# Patient Record
Sex: Male | Born: 1985 | Race: Black or African American | Hispanic: No | Marital: Married | Smoking: Never smoker
Health system: Southern US, Community
[De-identification: ages and names within clinical notes are randomized; demographics above are authoritative.]

## PROBLEM LIST (undated history)

## (undated) DIAGNOSIS — J45909 Unspecified asthma, uncomplicated: Secondary | ICD-10-CM

---
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
## 2005-06-13 ENCOUNTER — Emergency Department (HOSPITAL_COMMUNITY): Admission: EM | Admit: 2005-06-13 | Discharge: 2005-06-13 | Payer: Self-pay | Admitting: Family Medicine

## 2005-06-13 ENCOUNTER — Emergency Department (HOSPITAL_COMMUNITY): Admission: EM | Admit: 2005-06-13 | Discharge: 2005-06-13 | Payer: Self-pay | Admitting: Emergency Medicine

## 2009-02-21 ENCOUNTER — Emergency Department (HOSPITAL_COMMUNITY): Admission: EM | Admit: 2009-02-21 | Discharge: 2009-02-21 | Payer: Self-pay | Admitting: Emergency Medicine

## 2012-02-01 ENCOUNTER — Emergency Department (HOSPITAL_COMMUNITY)
Admission: EM | Admit: 2012-02-01 | Discharge: 2012-02-01 | Disposition: A | Discharge status: Home / Self Care | Payer: Self-pay | Attending: Emergency Medicine | Admitting: Emergency Medicine

## 2012-02-01 ENCOUNTER — Encounter (HOSPITAL_COMMUNITY): Payer: Self-pay | Admitting: Emergency Medicine

## 2012-02-01 DIAGNOSIS — J45901 Unspecified asthma with (acute) exacerbation: Secondary | ICD-10-CM | POA: Insufficient documentation

## 2012-02-01 HISTORY — DX: Unspecified asthma, uncomplicated: J45.909

## 2012-02-01 MED ORDER — ALBUTEROL SULFATE HFA 108 (90 BASE) MCG/ACT IN AERS: 2.0000 | INHALATION_SPRAY | RESPIRATORY_TRACT | Status: DC | PRN

## 2012-02-01 MED ORDER — ALBUTEROL SULFATE (5 MG/ML) 0.5% IN NEBU
2.5000 mg | INHALATION_SOLUTION | Freq: Once | RESPIRATORY_TRACT | Status: AC
  Administered 2012-02-01: 2.5 mg via RESPIRATORY_TRACT
  Filled 2012-02-01: qty 0.5

## 2012-02-01 MED ORDER — AEROCHAMBER PLUS FLO-VU LARGE MISC
1.0000 | Freq: Once | Status: AC
  Administered 2012-02-01: 1
  Filled 2012-02-01 (×4): qty 1

## 2012-02-01 MED ORDER — PREDNISONE 20 MG PO TABS: 40.0000 mg | ORAL_TABLET | Freq: Every day | ORAL | Status: DC

## 2012-02-01 MED ORDER — AZITHROMYCIN 250 MG PO TABS: 250.0000 mg | ORAL_TABLET | Freq: Every day | ORAL | Status: DC

## 2012-02-01 NOTE — ED Provider Notes (Signed)
History     CSN: [id number]  Arrival date & time 02/01/12  0702   First MD Initiated Contact with Patient 02/01/12 [phone]      Chief Complaint  Patient presents with  . Asthma    (Consider location/radiation/quality/duration/timing/severity/associated sxs/prior treatment) HPI Comments: This is a 26 year old male who presents to the ED with a chief complaint of asthma exacerbation.  He states that his asthma has been waking him up nearly every morning this week.  Normally he uses a primitine mist over night, but that he has run out and he believes this is what is causing the asthma exacerbations.  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  He denies chest pain, shortness of breath, nausea, vomiting, diarrhea, constipation.  He endorses wheezing.  The history is provided by the patient. No language interpreter was used.    Past Medical History  Diagnosis Date  . Asthma     History reviewed. No pertinent past surgical history.  Family History  Problem Relation Age of Onset  . Hypertension Other   . Diabetes Other     History  Substance Use Topics  . Smoking status: Never Smoker   . Smokeless tobacco: Not on file  . Alcohol Use: Yes     social      Review of Systems  Unable to perform ROS Constitutional: Negative for fever.  HENT: Negative for congestion.   Eyes: Negative for visual disturbance.  Respiratory: Positive for wheezing. Negative for cough, chest tightness and shortness of breath.   Cardiovascular: Negative for chest pain and palpitations.  Gastrointestinal: Negative for nausea, vomiting, abdominal pain and diarrhea.  Genitourinary: Negative for dysuria.  Musculoskeletal: Negative for back pain.  Skin: Negative for rash.  Neurological: Negative for weakness.  Psychiatric/Behavioral: Negative for agitation.  All other systems reviewed and are negative.    Allergies  Review of patient's allergies  indicates no known allergies.  Home Medications  No current outpatient prescriptions on file.  BP 129/90  Pulse 72  Temp 97.8 F (36.6 C) (Oral)  Resp 18  SpO2 99%  Physical Exam  Nursing note and vitals reviewed. Constitutional: He is oriented to person, place, and time. He appears well-developed and well-nourished.  HENT:  Head: Normocephalic and atraumatic.  Eyes: Conjunctivae normal and EOM are normal. Pupils are equal, round, and reactive to light.  Neck: Normal range of motion. Neck supple.  Cardiovascular: Normal rate and normal heart sounds.   Pulmonary/Chest: Effort normal. He has wheezes.  Abdominal: Soft. Bowel sounds are normal.  Musculoskeletal: Normal range of motion.  Neurological: He is alert and oriented to person, place, and time.  Skin: Skin is warm and dry.  Psychiatric: He has a normal mood and affect. His behavior is normal. Judgment and thought content normal.    ED Course  Procedures (including critical care time)  Labs Reviewed - No data to display No results found. ED ECG REPORT  I personally interpreted this EKG   Date: 02/01/2012   Rate: 70  Rhythm: sinus arrhythmia  QRS Axis: normal  Intervals: normal  ST/T Wave abnormalities: early repolarization  Conduction Disutrbances:none  Narrative Interpretation:   Old EKG Reviewed: none available     1. Asthma exacerbation       MDM  26 year old with asthma attack.  Will administer nebs treatment.  The patient states he recently noticed an irregular heart rhythm, so I will also get an  EKG as this is undocumented to this point.  EKG results documented above. Patient has a sinus arrhythmia and early repolarization.  Education provided, and return precautions given regarding chest pain, SOB, syncope, palpitations.  I have discussed this patient with Dr. Rulon Abide, and he has also gone to see him.  Will prescribe albuterol inhaler with spacer, z-pak, and short course of prednisone.  Have discussed this  with the patient.  He is agreeable to the plan.  Instructed the patient on establishing a PCP under the new AFA.  Recommend follow-up with PCP. The patient is stable and ready for discharge.          Roxy Horseman, PA-C 02/01/12 [phone]

## 2012-02-01 NOTE — ED Notes (Signed)
MD at bedside. 

## 2012-02-01 NOTE — ED Notes (Signed)
Discharge given to patient, pt denied any questions. Now waiting for the aerochamber from the pharmacy then pt will be discharge.

## 2012-02-01 NOTE — ED Notes (Signed)
Pt states he woke up around 0400 today with difficulty breathing  Pt states he was able to go to sleep but woke around 6 and it was worse  Pt states this has been happening every other night for the past couple of weeks  Pt states he has been using primatine mist OTC but ran out

## 2012-02-02 NOTE — ED Provider Notes (Signed)
Medical screening examination/treatment/procedure(s) were performed by non-physician practitioner and as supervising physician I was immediately available for consultation/collaboration.  John-Adam Danicka Hourihan, M.D.     John-Adam Elgie Maziarz, MD 02/02/12 1351 

## 2013-03-04 ENCOUNTER — Encounter (HOSPITAL_COMMUNITY): Payer: Self-pay | Admitting: Emergency Medicine

## 2013-03-04 ENCOUNTER — Emergency Department (HOSPITAL_COMMUNITY)
Admission: EM | Admit: 2013-03-04 | Discharge: 2013-03-04 | Disposition: A | Discharge status: Home / Self Care | Payer: No Typology Code available for payment source | Attending: Emergency Medicine | Admitting: Emergency Medicine

## 2013-03-04 ENCOUNTER — Emergency Department (HOSPITAL_COMMUNITY): Payer: No Typology Code available for payment source

## 2013-03-04 DIAGNOSIS — S20211A Contusion of right front wall of thorax, initial encounter: Secondary | ICD-10-CM

## 2013-03-04 DIAGNOSIS — W219XXA Striking against or struck by unspecified sports equipment, initial encounter: Secondary | ICD-10-CM | POA: Insufficient documentation

## 2013-03-04 DIAGNOSIS — Z79899 Other long term (current) drug therapy: Secondary | ICD-10-CM | POA: Insufficient documentation

## 2013-03-04 DIAGNOSIS — S20219A Contusion of unspecified front wall of thorax, initial encounter: Secondary | ICD-10-CM | POA: Insufficient documentation

## 2013-03-04 DIAGNOSIS — Y9239 Other specified sports and athletic area as the place of occurrence of the external cause: Secondary | ICD-10-CM | POA: Insufficient documentation

## 2013-03-04 DIAGNOSIS — J45909 Unspecified asthma, uncomplicated: Secondary | ICD-10-CM | POA: Insufficient documentation

## 2013-03-04 DIAGNOSIS — Y9362 Activity, american flag or touch football: Secondary | ICD-10-CM | POA: Insufficient documentation

## 2013-03-04 MED ORDER — METHOCARBAMOL 500 MG PO TABS: 500.0000 mg | ORAL_TABLET | Freq: Two times a day (BID) | ORAL | Status: DC

## 2013-03-04 MED ORDER — NAPROXEN 500 MG PO TABS: 500.0000 mg | ORAL_TABLET | Freq: Two times a day (BID) | ORAL | Status: DC

## 2013-03-04 NOTE — ED Provider Notes (Signed)
CSN: [id number]     Arrival date & time 03/04/13  2015 History  This chart was scribed for non-physician practitioner, Fayrene Helper, PA-C,working with Geoffery Lyons, MD, by Karle Plumber, ED Scribe.  This patient was seen in room TR07C/TR07C and the patient's care was started at 9:48 PM.  Chief Complaint  Patient presents with  . Shoulder Injury   The history is provided by the patient. No language interpreter was used.   HPI Comments:  Tyler Jacobs is a 27 y.o. male who presents to the Emergency Department complaining of a left shoulder injury onset several hours ago. He reports the pain is a sharp pain from his trapezius muscle down into his chest.  Pt states he was playing flag football with no pads or protection and collided with another player. He states the pain worsens with movement. Pt denies numbness and tingling.    Past Medical History  Diagnosis Date  . Asthma    History reviewed. No pertinent past surgical history. Family History  Problem Relation Age of Onset  . Hypertension Other   . Diabetes Other    History  Substance Use Topics  . Smoking status: Never Smoker   . Smokeless tobacco: Not on file  . Alcohol Use: Yes     Comment: social    Review of Systems  Constitutional: Negative for fever.  Musculoskeletal: Positive for myalgias. Negative for back pain and neck pain.  Neurological: Negative for syncope, numbness and headaches.    Allergies  Review of patient's allergies indicates no known allergies.  Home Medications   Current Outpatient Rx  Name  Route  Sig  Dispense  Refill  . albuterol (PROVENTIL HFA;VENTOLIN HFA) 108 (90 BASE) MCG/ACT inhaler   Inhalation   Inhale 2 puffs into the lungs every 4 (four) hours as needed for wheezing or shortness of breath.   1 Inhaler   3    Triage Vitals: BP 126/83  Pulse 87  Temp(Src) 98.4 F (36.9 C) (Oral)  Resp 16  Ht 5\' 6"  (1.676 m)  Wt 206 lb (93.441 kg)  BMI 33.27 kg/m2  SpO2 96% Physical Exam   Nursing note and vitals reviewed. Constitutional: He is oriented to person, place, and time. He appears well-developed and well-nourished. No distress.  HENT:  Head: Normocephalic and atraumatic.  Eyes: Conjunctivae are normal. No scleral icterus.  Neck: Neck supple.  Cardiovascular: Normal rate and intact distal pulses.   Pulmonary/Chest: Effort normal. No stridor. No respiratory distress.  Abdominal: Normal appearance.  Musculoskeletal:  Right paracervical and trapezius tenderness. Tenderness to upper right pectoralis. Chest without crepitus, emphysema or paradoxical chest movement. Right shoulder with full ROM. No obvious deformity.   Neurological: He is alert and oriented to person, place, and time.  Skin: Skin is warm and dry. No rash noted.  Psychiatric: He has a normal mood and affect. His behavior is normal.    ED Course  Procedures (including critical care time) DIAGNOSTIC STUDIES: Oxygen Saturation is 96% on RA, adequate by my interpretation.   COORDINATION OF CARE: 9:52 PM- Will give pt pain medication and muscle relaxer. Pt verbalizes understanding and agrees to plan.  Likely chest contusion, and muscle strain as it is reproducible. Doubt rib fx, shoulder dislocation, or spine injury.  RICE therapy suggested.      Medications - No data to display  Labs Review Labs Reviewed - No data to display Imaging Review No results found.  EKG Interpretation   None  MDM   1. Chest wall contusion, right, initial encounter    BP 126/83  Pulse 87  Temp(Src) 98.4 F (36.9 C) (Oral)  Resp 16  Ht 5\' 6"  (1.676 m)  Wt 206 lb (93.441 kg)  BMI 33.27 kg/m2  SpO2 96%   I personally performed the services described in this documentation, which was scribed in my presence. The recorded information has been reviewed and is accurate.     Fayrene Helper, PA-C 03/04/13 2227

## 2013-03-04 NOTE — ED Provider Notes (Signed)
Medical screening examination/treatment/procedure(s) were performed by non-physician practitioner and as supervising physician I was immediately available for consultation/collaboration.  EKG Interpretation   None        Kelsy Polack, MD 03/04/13 2355 

## 2013-03-04 NOTE — ED Notes (Signed)
Pt. injured his right shoulder with pain while playing flag football this afternoon , pain worse with movement and certain positions , pain radiating to right side of neck , ambulatory .

## 2013-04-07 ENCOUNTER — Encounter (HOSPITAL_COMMUNITY): Payer: Self-pay | Admitting: Emergency Medicine

## 2013-04-07 ENCOUNTER — Emergency Department (HOSPITAL_COMMUNITY): Payer: No Typology Code available for payment source

## 2013-04-07 ENCOUNTER — Emergency Department (HOSPITAL_COMMUNITY)
Admission: EM | Admit: 2013-04-07 | Discharge: 2013-04-07 | Disposition: A | Discharge status: Home / Self Care | Payer: No Typology Code available for payment source | Attending: Emergency Medicine | Admitting: Emergency Medicine

## 2013-04-07 DIAGNOSIS — R296 Repeated falls: Secondary | ICD-10-CM | POA: Insufficient documentation

## 2013-04-07 DIAGNOSIS — S8990XA Unspecified injury of unspecified lower leg, initial encounter: Secondary | ICD-10-CM | POA: Insufficient documentation

## 2013-04-07 DIAGNOSIS — X500XXA Overexertion from strenuous movement or load, initial encounter: Secondary | ICD-10-CM | POA: Insufficient documentation

## 2013-04-07 DIAGNOSIS — Y929 Unspecified place or not applicable: Secondary | ICD-10-CM | POA: Insufficient documentation

## 2013-04-07 DIAGNOSIS — S8992XA Unspecified injury of left lower leg, initial encounter: Secondary | ICD-10-CM

## 2013-04-07 DIAGNOSIS — J45909 Unspecified asthma, uncomplicated: Secondary | ICD-10-CM | POA: Insufficient documentation

## 2013-04-07 DIAGNOSIS — Z79899 Other long term (current) drug therapy: Secondary | ICD-10-CM | POA: Insufficient documentation

## 2013-04-07 DIAGNOSIS — Y9301 Activity, walking, marching and hiking: Secondary | ICD-10-CM | POA: Insufficient documentation

## 2013-04-07 MED ORDER — NAPROXEN 500 MG PO TABS: 500.0000 mg | ORAL_TABLET | Freq: Two times a day (BID) | ORAL | Status: DC

## 2013-04-07 NOTE — ED Notes (Signed)
The pt is c/o lt knee pain since this am when he was walking.  He thinks he hyperextended the knee

## 2013-04-07 NOTE — ED Provider Notes (Signed)
CSN: [id number]     Arrival date & time 04/07/13  1814 History  This chart was scribed for non-physician practitioner, Renne Crigler, PA-C, working with Flint Melter, MD, by Shari Heritage, ED Scribe. This patient was seen in room TR08C/TR08C and the patient's care was started at 7:44 PM.  Chief Complaint  Patient presents with  . Knee Injury   The history is provided by the patient. No language interpreter was used.   HPI Comments: Tyler Jacobs is a 27 y.o. male who presents to the Emergency Department complaining of constant left knee pain that radiates down his leg onset this morning while recreating. He rates pain as 7/10 while at rest. He states that he was running, planted his foot and his knee "buckled" causing him to fall. He was able to get up by himself after the fall. He states that he heard a pop and developed immediate pain. Pain is worse while ambulating and with movement of his knee, and is relieved with rest especially while sitting. He hasn't taken any medications for pain relief. He denies any other injuries at this time. There is no numbness or weakness in left lower extremity.   Past Medical History  Diagnosis Date  . Asthma    No past surgical history on file. Family History  Problem Relation Age of Onset  . Hypertension Other   . Diabetes Other    History  Substance Use Topics  . Smoking status: Never Smoker   . Smokeless tobacco: Not on file  . Alcohol Use: Yes     Comment: social    Review of Systems  Constitutional: Negative for activity change.  Musculoskeletal: Positive for arthralgias (Left knee) and myalgias. Negative for back pain, gait problem, joint swelling and neck pain.  Skin: Negative for wound.  Neurological: Negative for weakness and numbness.    Allergies  Review of patient's allergies indicates no known allergies.  Home Medications   Current Outpatient Rx  Name  Route  Sig  Dispense  Refill  . albuterol (PROVENTIL HFA;VENTOLIN HFA)  108 (90 BASE) MCG/ACT inhaler   Inhalation   Inhale 2 puffs into the lungs every 4 (four) hours as needed for wheezing or shortness of breath.   1 Inhaler   3   . methocarbamol (ROBAXIN) 500 MG tablet   Oral   Take 1 tablet (500 mg total) by mouth 2 (two) times daily.   20 tablet   0   . naproxen (NAPROSYN) 500 MG tablet   Oral   Take 1 tablet (500 mg total) by mouth 2 (two) times daily.   30 tablet   0    Triage Vitals: BP 127/77  Pulse 94  Temp(Src) 99 F (37.2 C) (Oral)  Resp 16  Ht 5\' 6"  (1.676 m)  Wt 205 lb (92.987 kg)  BMI 33.10 kg/m2  SpO2 97% Physical Exam  Nursing note and vitals reviewed. Constitutional: He appears well-developed and well-nourished. No distress.  HENT:  Head: Normocephalic and atraumatic.  Eyes: Conjunctivae are normal.  Neck: Normal range of motion. Neck supple. No tracheal deviation present.  Cardiovascular: Normal rate and normal pulses.   Pulses:      Dorsalis pedis pulses are 2+ on the right side, and 2+ on the left side.       Posterior tibial pulses are 2+ on the left side.  Pulmonary/Chest: Effort normal. No respiratory distress.  Musculoskeletal: He exhibits tenderness. He exhibits no edema.  Left hip: Normal.       Left knee: He exhibits normal range of motion, no swelling and no effusion. Tenderness found. Medial joint line, lateral joint line, MCL and LCL tenderness noted. No patellar tendon tenderness noted.       Left ankle: Normal.  General tenderness and joint laxity of left knee. No large effusion. Patient able to extend knee against gravity.   Neurological: He is alert. No sensory deficit.  Motor, sensation, and vascular distal to the injury is fully intact.   Skin: Skin is warm and dry.  Psychiatric: He has a normal mood and affect. His behavior is normal.    ED Course  Procedures  DIAGNOSTIC STUDIES: Oxygen Saturation is 97% on RA, normal by my interpretation.    COORDINATION OF CARE: 7:48 PM- Knee x-ray is  negative for fracture, but suspect non-bony injury. Will provide crutches, knee immobilizer and referral to orthopedics for follow up. Pt advised of plan for treatment and pt agrees.   Imaging Review Dg Knee Complete 4 Views Left  04/07/2013   CLINICAL DATA:  Hyperextended knee playing sports.  Pain.  EXAM: LEFT KNEE - COMPLETE 4+ VIEW  COMPARISON:  None.  FINDINGS: There is no evidence of fracture, dislocation, or joint effusion. There is no evidence of arthropathy or other focal bone abnormality. Soft tissues are unremarkable.  IMPRESSION: Negative.   Electronically Signed   By: Davonna Belling M.D.   On: 04/07/2013 19:31    EKG Interpretation   None      Vital signs reviewed and are as follows: Filed Vitals:   04/07/13 1819  BP: 127/77  Pulse: 94  Temp: 99 F (37.2 C)  Resp: 16   Advised orthopedic f/u for diagnosis and management.   Patient was counseled on RICE protocol and told to rest injury, use ice for no longer than 15 minutes every hour, compress the area, and elevate above the level of their heart as much as possible to reduce swelling.  Questions answered.  Patient verbalized understanding.     MDM   1. Knee injury, left, initial encounter    Suspect ligamentous injury to knee. Crutches, knee immobilizer here with ortho follow-up for evaluation. Lower extremity is neurovascularly intact. Pt can extend knee against gravity. No signs of compartment syndrome.   I personally performed the services described in this documentation, which was scribed in my presence. The recorded information has been reviewed and is accurate.   Renne Crigler, PA-C 04/07/13 2142

## 2013-04-07 NOTE — Progress Notes (Signed)
Orthopedic Tech Progress Note Patient Details:  Tyler Jacobs Mar 23, 1986 [id number]  Ortho Devices Type of Ortho Device: Knee Immobilizer;Crutches Ortho Device/Splint Location: LLE Ortho Device/Splint Interventions: Ordered;Application   [name] 04/07/2013, 8:26 PM

## 2013-04-08 NOTE — ED Provider Notes (Signed)
Medical screening examination/treatment/procedure(s) were performed by non-physician practitioner and as supervising physician I was immediately available for consultation/collaboration.  Rohith Fauth L Indigo Chaddock, MD 04/08/13 0043 

## 2013-12-31 ENCOUNTER — Ambulatory Visit (INDEPENDENT_AMBULATORY_CARE_PROVIDER_SITE_OTHER): Payer: BC Managed Care – PPO | Admitting: Emergency Medicine

## 2013-12-31 VITALS — BP 108/72 | HR 66 | Temp 98.1°F | Resp 16 | Ht 65.75 in | Wt 215.4 lb

## 2013-12-31 DIAGNOSIS — J209 Acute bronchitis, unspecified: Secondary | ICD-10-CM

## 2013-12-31 DIAGNOSIS — G4733 Obstructive sleep apnea (adult) (pediatric): Secondary | ICD-10-CM

## 2013-12-31 MED ORDER — AZITHROMYCIN 250 MG PO TABS: ORAL_TABLET | ORAL | Status: DC

## 2013-12-31 MED ORDER — PROMETHAZINE-CODEINE 6.25-10 MG/5ML PO SYRP: 5.0000 mL | ORAL_SOLUTION | Freq: Four times a day (QID) | ORAL | Status: DC | PRN

## 2013-12-31 NOTE — Progress Notes (Signed)
[facility] [address] [place] [address] [phone]  Date:  12/31/2013   Name:  Tyler Jacobs   DOB:  1985/10/20   MRN:  [id number]  PCP:  No PCP Per Patient    Chief Complaint: Cough and Sore Throat   History of Present Illness:  Tyler Jacobs is a 28 y.o. very pleasant male patient who presents with the following:  Has experienced a cough for the past week.  Productive of scant mucoid sputum.  No wheezing or shortness of breath.  Worse when lays flat Has nasal congestion and drainage.  Mostly mucoid. Moderate post nasal drainage. Sore throat.  No fever or chills. Snores and wakes up during night.  Awakens tired No improvement with over the counter medications or other home remedies. Denies other complaint or health concern today.   There are no active problems to display for this patient.   Past Medical History  Diagnosis Date  . Asthma     History reviewed. No pertinent past surgical history.  History  Substance Use Topics  . Smoking status: Never Smoker   . Smokeless tobacco: Not on file  . Alcohol Use: Yes     Comment: social    Family History  Problem Relation Age of Onset  . Hypertension Other   . Diabetes Other     No Known Allergies  Medication list has been reviewed and updated.  Current Outpatient Prescriptions on File Prior to Visit  Medication Sig Dispense Refill  . albuterol (PROVENTIL HFA;VENTOLIN HFA) 108 (90 BASE) MCG/ACT inhaler Inhale 1-2 puffs into the lungs every 6 (six) hours as needed for wheezing or shortness of breath.      . naproxen (NAPROSYN) 500 MG tablet Take 1 tablet (500 mg total) by mouth 2 (two) times daily.  20 tablet  0   No current facility-administered medications on file prior to visit.    Review of Systems:  As per HPI, otherwise negative.    Physical Examination: Filed Vitals:   12/31/13 1045  BP: 108/72  Pulse: 66  Temp: 98.1 F (36.7 C)  Resp: 16   Filed Vitals:   12/31/13 1045  Height: 5' 5.75" (1.67 m)  Weight: 215 lb 6.4 oz (97.705 kg)   Body mass index is 35.03 kg/(m^2). Ideal Body Weight: Weight in (lb) to have BMI = 25: 153.4  GEN: WDWN, NAD, Non-toxic, A & O x 3 HEENT: Atraumatic, Normocephalic. Neck supple. No masses, No LAD. Ears and Nose: No external deformity. CV: RRR, No M/G/R. No JVD. No thrill. No extra heart sounds. PULM: CTA B, no wheezes, crackles, rhonchi. No retractions. No resp. distress. No accessory muscle use. ABD: S, NT, ND, +BS. No rebound. No HSM. EXTR: No c/c/e NEURO Normal gait.  PSYCH: Normally interactive. Conversant. Not depressed or anxious appearing.  Calm demeanor.    Assessment and Plan: OSA Bronchitis Sleep study zpak Phen c cod  Signed,  Phillips Odor, MD

## 2014-01-01 ENCOUNTER — Telehealth: Payer: Self-pay

## 2014-01-01 NOTE — Telephone Encounter (Signed)
PATIENT STATES HE SAW DR. Dareen Piano ON Monday FOR PROBLEMS SLEEPING. HE WENT TO WORK ON Tuesday, BUT HE WOULD LIKE TO TAKE THE DAY OFF ON Wednesday AND RETURN BACK TO WORK ON Thursday. HE SAYS THE MEDICINE HE WAS GIVEN IS MAKING HIM FEEL TIRED. HE JUST NEEDS TO GET SOME REST. PLEASE CALL HIM WHEN IT IS READY TO BE PICKED UP. BEST PHONE [phone] (CELL)   MBC

## 2014-01-02 NOTE — Telephone Encounter (Signed)
OOW note written. Pt advised.  Letter faxed. Original in the pick up drawer.

## 2014-01-02 NOTE — Telephone Encounter (Signed)
OK for work note as requested. Remind him to take the cough medication only at bedtime.

## 2014-01-02 NOTE — Telephone Encounter (Signed)
Patient is calling for a NOTE for OOW.     He will come in and bring a fax number with him   [phone]

## 2014-01-02 NOTE — Telephone Encounter (Signed)
Pt called again about his work note. I told him we must wait until we get permission from a provider to write his note. I told him we would give him a call once we hear back from a provider.   The note can be faxed to [phone]

## 2014-01-02 NOTE — Telephone Encounter (Signed)
Please advise if OOW note is appropriate.

## 2019-07-13 ENCOUNTER — Emergency Department: Payer: BC Managed Care – PPO

## 2019-07-13 ENCOUNTER — Observation Stay
Admission: EM | Admit: 2019-07-13 | Discharge: 2019-07-15 | Disposition: A | Discharge status: Home / Self Care | Payer: BC Managed Care – PPO | Attending: General Surgery | Admitting: General Surgery

## 2019-07-13 ENCOUNTER — Other Ambulatory Visit: Payer: Self-pay

## 2019-07-13 ENCOUNTER — Ambulatory Visit
Admission: EM | Admit: 2019-07-13 | Discharge: 2019-07-13 | Disposition: A | Discharge status: Other Acute Inpatient Hospital | Payer: BC Managed Care – PPO

## 2019-07-13 DIAGNOSIS — Z79899 Other long term (current) drug therapy: Secondary | ICD-10-CM | POA: Insufficient documentation

## 2019-07-13 DIAGNOSIS — R1031 Right lower quadrant pain: Secondary | ICD-10-CM

## 2019-07-13 DIAGNOSIS — K449 Diaphragmatic hernia without obstruction or gangrene: Secondary | ICD-10-CM | POA: Insufficient documentation

## 2019-07-13 DIAGNOSIS — K353 Acute appendicitis with localized peritonitis, without perforation or gangrene: Principal | ICD-10-CM | POA: Insufficient documentation

## 2019-07-13 DIAGNOSIS — R100 Acute abdomen: Secondary | ICD-10-CM

## 2019-07-13 DIAGNOSIS — Z20822 Contact with and (suspected) exposure to covid-19: Secondary | ICD-10-CM | POA: Diagnosis not present

## 2019-07-13 DIAGNOSIS — K3580 Unspecified acute appendicitis: Secondary | ICD-10-CM | POA: Diagnosis present

## 2019-07-13 DIAGNOSIS — J45909 Unspecified asthma, uncomplicated: Secondary | ICD-10-CM | POA: Diagnosis not present

## 2019-07-13 DIAGNOSIS — Z791 Long term (current) use of non-steroidal anti-inflammatories (NSAID): Secondary | ICD-10-CM | POA: Diagnosis not present

## 2019-07-13 DIAGNOSIS — R1110 Vomiting, unspecified: Secondary | ICD-10-CM

## 2019-07-13 LAB — COMPREHENSIVE METABOLIC PANEL
ALT: 33 U/L (ref 0–44)
AST: 29 U/L (ref 15–41)
Albumin: 4.6 g/dL (ref 3.5–5.0)
Alkaline Phosphatase: 49 U/L (ref 38–126)
Anion gap: 12 (ref 5–15)
BUN: 12 mg/dL (ref 6–20)
CO2: 23 mmol/L (ref 22–32)
Calcium: 9.2 mg/dL (ref 8.9–10.3)
Chloride: 102 mmol/L (ref 98–111)
Creatinine, Ser: 1.18 mg/dL (ref 0.61–1.24)
GFR calc Af Amer: >60 mL/min (ref >60)
GFR calc non Af Amer: >60 mL/min (ref >60)
Glucose, Bld: 126 mg/dL — ABNORMAL HIGH (ref 70–99)
Potassium: 3.8 mmol/L (ref 3.5–5.1)
Sodium: 137 mmol/L (ref 135–145)
Total Bilirubin: 1.1 mg/dL (ref 0.3–1.2)
Total Protein: 8.1 g/dL (ref 6.5–8.1)

## 2019-07-13 LAB — LIPASE, BLOOD: Lipase: 22 U/L (ref 11–51)

## 2019-07-13 LAB — CBC
HCT: 44.4 % (ref 39.0–52.0)
Hemoglobin: 15.7 g/dL (ref 13.0–17.0)
MCH: 28.2 pg (ref 26.0–34.0)
MCHC: 35.4 g/dL (ref 30.0–36.0)
MCV: 79.9 fL — ABNORMAL LOW (ref 80.0–100.0)
Platelets: 188 10*3/uL (ref 150–400)
RBC: 5.56 MIL/uL (ref 4.22–5.81)
RDW: 13.4 % (ref 11.5–15.5)
WBC: 11.7 10*3/uL — ABNORMAL HIGH (ref 4.0–10.5)
nRBC: 0 % (ref 0.0–0.2)

## 2019-07-13 LAB — RESPIRATORY PANEL BY RT PCR (FLU A&B, COVID)
Influenza A by PCR: NEGATIVE
Influenza B by PCR: NEGATIVE
SARS Coronavirus 2 by RT PCR: NEGATIVE

## 2019-07-13 MED ORDER — METRONIDAZOLE IN NACL 5-0.79 MG/ML-% IV SOLN
500.0000 mg | Freq: Three times a day (TID) | INTRAVENOUS | Status: DC
  Administered 2019-07-13 – 2019-07-15 (×5): 500 mg via INTRAVENOUS
  Filled 2019-07-13 (×10): qty 100

## 2019-07-13 MED ORDER — FENTANYL CITRATE (PF) 100 MCG/2ML IJ SOLN
50.0000 ug | INTRAMUSCULAR | Status: DC | PRN
  Administered 2019-07-13: 50 ug via INTRAVENOUS
  Filled 2019-07-13: qty 2

## 2019-07-13 MED ORDER — SODIUM CHLORIDE 0.9 % IV SOLN: Freq: Once | INTRAVENOUS | Status: AC

## 2019-07-13 MED ORDER — IOHEXOL 300 MG/ML  SOLN
100.0000 mL | Freq: Once | INTRAMUSCULAR | Status: AC | PRN
  Administered 2019-07-13: 11:00:00 100 mL via INTRAVENOUS

## 2019-07-13 MED ORDER — HYDROMORPHONE HCL 1 MG/ML IJ SOLN: 0.5000 mg | INTRAMUSCULAR | Status: DC | PRN

## 2019-07-13 MED ORDER — ONDANSETRON HCL 4 MG/2ML IJ SOLN
4.0000 mg | Freq: Once | INTRAMUSCULAR | Status: AC
  Administered 2019-07-13: 12:00:00 4 mg via INTRAVENOUS
  Filled 2019-07-13: qty 2

## 2019-07-13 MED ORDER — MORPHINE SULFATE (PF) 4 MG/ML IV SOLN
4.0000 mg | Freq: Once | INTRAVENOUS | Status: AC
  Administered 2019-07-13: 12:00:00 4 mg via INTRAVENOUS
  Filled 2019-07-13: qty 1

## 2019-07-13 MED ORDER — METRONIDAZOLE IN NACL 5-0.79 MG/ML-% IV SOLN
500.0000 mg | Freq: Once | INTRAVENOUS | Status: AC
  Administered 2019-07-13: 13:00:00 500 mg via INTRAVENOUS
  Filled 2019-07-13: qty 100

## 2019-07-13 MED ORDER — ONDANSETRON HCL 4 MG/2ML IJ SOLN
4.0000 mg | Freq: Four times a day (QID) | INTRAMUSCULAR | Status: DC | PRN
  Administered 2019-07-14: 4 mg via INTRAVENOUS

## 2019-07-13 MED ORDER — KETOROLAC TROMETHAMINE 30 MG/ML IJ SOLN
30.0000 mg | Freq: Four times a day (QID) | INTRAMUSCULAR | Status: DC | PRN
  Administered 2019-07-13 (×2): 30 mg via INTRAVENOUS
  Filled 2019-07-13 (×2): qty 1

## 2019-07-13 MED ORDER — IOHEXOL 9 MG/ML PO SOLN
500.0000 mL | Freq: Once | ORAL | Status: DC | PRN
  Filled 2019-07-13: qty 500

## 2019-07-13 MED ORDER — SODIUM CHLORIDE 0.9 % IV SOLN
2.0000 g | INTRAVENOUS | Status: DC
  Administered 2019-07-14 – 2019-07-15 (×2): 2 g via INTRAVENOUS
  Filled 2019-07-13: qty 2
  Filled 2019-07-13: qty 20
  Filled 2019-07-13: qty 2

## 2019-07-13 MED ORDER — SODIUM CHLORIDE 0.9 % IV SOLN
2.0000 g | Freq: Once | INTRAVENOUS | Status: AC
  Administered 2019-07-13: 13:00:00 2 g via INTRAVENOUS
  Filled 2019-07-13: qty 20

## 2019-07-13 MED ORDER — ONDANSETRON 4 MG PO TBDP: 4.0000 mg | ORAL_TABLET | Freq: Four times a day (QID) | ORAL | Status: DC | PRN

## 2019-07-13 MED ORDER — ACETAMINOPHEN 500 MG PO TABS
1000.0000 mg | ORAL_TABLET | Freq: Four times a day (QID) | ORAL | Status: DC
  Administered 2019-07-13 – 2019-07-15 (×6): 1000 mg via ORAL
  Filled 2019-07-13 (×8): qty 2

## 2019-07-13 NOTE — Anesthesia Preprocedure Evaluation (Addendum)
Anesthesia Evaluation  Patient identified by MRN, date of birth, ID band Patient awake    Reviewed: Allergy & Precautions, NPO status , Patient's Chart, lab work & pertinent test results  Airway Mallampati: II       Dental   Pulmonary asthma ,    Pulmonary exam normal        Cardiovascular negative cardio ROS Normal cardiovascular exam     Neuro/Psych negative neurological ROS  negative psych ROS   GI/Hepatic Neg liver ROS,   Endo/Other  negative endocrine ROS  Renal/GU negative Renal ROS  negative genitourinary   Musculoskeletal negative musculoskeletal ROS (+)   Abdominal Normal abdominal exam  (+)   Peds negative pediatric ROS (+)  Hematology negative hematology ROS (+)   Anesthesia Other Findings Past Medical History: No date: Asthma   Reproductive/Obstetrics                            Anesthesia Physical Anesthesia Plan  ASA: II and emergent  Anesthesia Plan: General   Post-op Pain Management:    Induction: Intravenous  PONV Risk Score and Plan:   Airway Management Planned: Oral ETT  Additional Equipment:   Intra-op Plan:   Post-operative Plan: Extubation in OR  Informed Consent: I have reviewed the patients History and Physical, chart, labs and discussed the procedure including the risks, benefits and alternatives for the proposed anesthesia with the patient or authorized representative who has indicated his/her understanding and acceptance.     Dental advisory given  Plan Discussed with: CRNA and Surgeon  Anesthesia Plan Comments:         Anesthesia Quick Evaluation

## 2019-07-13 NOTE — H&P (Addendum)
[facility] SURGICAL HISTORY & PHYSICAL (cpt [id number])  HISTORY OF PRESENT ILLNESS (HPI):  34 y.o. male presented to [facility] ED today for abdominal pain. Patient reports that he noticed the gradual onset of abdominal discomfort yesterday. He thought this was gas. Discomfort was centrally at first. However throughout the course of the day and into the night the pain worsened, became more severe, and localized to his RLQ. He notes associated decreased appetite, chills, nausea, and emesis. No history of similar pain in the past. No previous abdominal surgeries. He presented to urgent care but was referred to the ED with concerns over presentation. Work up in our ED was concerning for leukocytosis and acute uncomplicated appendicitis on CT.   General surgery is consulted by emergency medicine physician Dr Shaune Pollack, MD for evaluation and management of acute appendicitis.   PAST MEDICAL HISTORY (PMH):  Past Medical History:  Diagnosis Date  . Asthma     Reviewed. Otherwise negative.   PAST SURGICAL HISTORY (PSH):  History reviewed. No pertinent surgical history.  Reviewed. Otherwise negative.   MEDICATIONS:  Prior to Admission medications   Medication Sig Start Date End Date Taking? Authorizing Provider  albuterol (PROVENTIL HFA;VENTOLIN HFA) 108 (90 BASE) MCG/ACT inhaler Inhale 1-2 puffs into the lungs every 6 (six) hours as needed for wheezing or shortness of breath.    [provider]  azithromycin (ZITHROMAX) 250 MG tablet Take 2 tabs PO x 1 dose, then 1 tab PO QD x 4 days 12/31/13   Carmelina Dane, MD  naproxen (NAPROSYN) 500 MG tablet Take 1 tablet (500 mg total) by mouth 2 (two) times daily. 04/07/13   Renne Crigler, PA-C  promethazine-codeine (PHENERGAN WITH CODEINE) 6.25-10 MG/5ML syrup Take 5-10 mLs by mouth every 6 (six) hours as needed. 12/31/13   Carmelina Dane, MD     ALLERGIES:  No Known Allergies   SOCIAL HISTORY:  Social History    Socioeconomic History  . Marital status: Single    Spouse name: Not on file  . Number of children: Not on file  . Years of education: Not on file  . Highest education level: Not on file  Occupational History  . Not on file  Tobacco Use  . Smoking status: Never Smoker  . Smokeless tobacco: Never Used  Substance and Sexual Activity  . Alcohol use: Yes    Comment: social  . Drug use: No  . Sexual activity: Yes    Birth control/protection: None    Comment: married  Other Topics Concern  . Not on file  Social History Narrative  . Not on file   Social Determinants of Health   Financial Resource Strain:   . Difficulty of Paying Living Expenses:   Food Insecurity:   . Worried About Programme researcher, broadcasting/film/video in the Last Year:   . Barista in the Last Year:   Transportation Needs:   . Freight forwarder (Medical):   Marland Kitchen Lack of Transportation (Non-Medical):   Physical Activity:   . Days of Exercise per Week:   . Minutes of Exercise per Session:   Stress:   . Feeling of Stress :   Social Connections:   . Frequency of Communication with Friends and Family:   . Frequency of Social Gatherings with Friends and Family:   . Attends Religious Services:   . Active Member of Clubs or Organizations:   . Attends Banker Meetings:   Marland Kitchen Marital Status:   Intimate  Partner Violence:   . Fear of Current or Ex-Partner:   . Emotionally Abused:   Marland Kitchen Physically Abused:   . Sexually Abused:      FAMILY HISTORY:  Family History  Problem Relation Age of Onset  . Hypertension Other   . Diabetes Other   . Cancer Mother   . Hypertension Mother     Otherwise negative.   REVIEW OF SYSTEMS:  Review of Systems  Constitutional: Positive for chills. Negative for fever.  Respiratory: Negative for cough and shortness of breath.   Cardiovascular: Negative for chest pain and palpitations.  Gastrointestinal: Positive for abdominal pain, nausea and vomiting. Negative for blood in  stool, constipation and diarrhea.  Genitourinary: Negative for dysuria and urgency.  All other systems reviewed and are negative.   VITAL SIGNS:  Temp:  [98.3 F (36.8 C)] 98.3 F (36.8 C) (03/19 0857) Pulse Rate:  [71-89] 71 (03/19 0857) Resp:  [25-32] 25 (03/19 0857) BP: (112-122)/(65-81) 112/65 (03/19 0857) SpO2:  [98 %] 98 % (03/19 0857) Weight:  [102.1 kg] 102.1 kg (03/19 0856)     Height: 5\' 6"  (167.6 cm) Weight: 102.1 kg BMI (Calculated): 36.33   PHYSICAL EXAM:  Physical Exam Vitals and nursing note reviewed.  Constitutional:      General: He is not in acute distress.    Appearance: He is well-developed. He is obese. He is not ill-appearing.  HENT:     Head: Normocephalic and atraumatic.  Eyes:     General: No scleral icterus.    Extraocular Movements: Extraocular movements intact.  Cardiovascular:     Rate and Rhythm: Normal rate and regular rhythm.     Heart sounds: Normal heart sounds. No murmur.  Pulmonary:     Effort: Pulmonary effort is normal. No respiratory distress.  Abdominal:     General: There is no distension.     Palpations: Abdomen is soft.     Tenderness: There is abdominal tenderness. There is no guarding or rebound. Positive signs include Rovsing's sign and McBurney's sign.  Genitourinary:    Comments: Deferred Skin:    General: Skin is warm and dry.     Coloration: Skin is not jaundiced or pale.  Neurological:     General: No focal deficit present.     Mental Status: He is alert and oriented to person, place, and time.  Psychiatric:        Mood and Affect: Mood normal.        Behavior: Behavior normal.     INTAKE/OUTPUT:  This shift: No intake/output data recorded.  Last 2 shifts: @IOLAST2SHIFTS @  Labs:  CBC Latest Ref Rng & Units 07/13/2019  WBC 4.0 - 10.5 K/uL 11.7(H)  Hemoglobin 13.0 - 17.0 g/dL 15.7  Hematocrit 39.0 - 52.0 % 44.4  Platelets 150 - 400 K/uL 188   CMP Latest Ref Rng & Units 07/13/2019  Glucose 70 - 99 mg/dL 126(H)   BUN 6 - 20 mg/dL 12  Creatinine 0.61 - 1.24 mg/dL 1.18  Sodium 135 - 145 mmol/L 137  Potassium 3.5 - 5.1 mmol/L 3.8  Chloride 98 - 111 mmol/L 102  CO2 22 - 32 mmol/L 23  Calcium 8.9 - 10.3 mg/dL 9.2  Total Protein 6.5 - 8.1 g/dL 8.1  Total Bilirubin 0.3 - 1.2 mg/dL 1.1  Alkaline Phos 38 - 126 U/L 49  AST 15 - 41 U/L 29  ALT 0 - 44 U/L 33     Imaging studies:   CT Abdomen/Pelvis (07/13/2019) personally reviewed  showing dilated appendix with stranding and appendicolith without abscess or free air, and radiologist report reviewed:  IMPRESSION: 1.  Findings indicative of acute appendiceal inflammation.  Appendix: Location: Appendix arises from the inferolateral cecum, located in the right lower quadrant and upper pelvis laterally on the right.  Diameter: 22 mm proximally.  Appendicolith: 9 x 4 mm at the base  Mucosal hyper-enhancement: Present throughout much of the mid to distal appendix.  Extraluminal gas: None  Periappendiceal collection: Soft tissue stranding but only minimal fluid. No abscess.  2.  No bowel obstruction.  No abscess in the abdomen or pelvis.  3.  Small hiatal hernia.    Assessment/Plan: (ICD-10's: K47.80) 34 y.o. male with leukocytosis and RLQ pain attributable to acute uncomplicated appendicitis   - Admit to general surgery  - Okay for CLD today; NPO after midnight  - Continue IV ABx (Ceftriaxone + Flagyl)   - pain control prn; antiemetics  - Monitor abdominal examination   - Plan for laparoscopic appendectomy tomorrow with Dr Lady Gary pending OR./Anesthesia availability. Unfortunately, no OR time available today  - All risks, benefits, and alternatives to above procedure(s) were discussed with the patient and his family, all of his questions were answered to his expressed satisfaction, patient expresses he wishes to proceed, and informed consent was obtained.  - Hold chemical DVT prophylaxis  All of the above findings and  recommendations were discussed with the patient, and all of his questions were answered to his expressed satisfaction.  -- Lynden Oxford, PA-C [facility] 07/13/2019, 12:19 PM [phone] M-F: 7am - 4pm  I saw and evaluated the patient.  I agree with the above documentation, exam, and plan, which I have edited where appropriate. Marte Celani  1:50 PM

## 2019-07-13 NOTE — ED Notes (Signed)
Pt given warm blanket by this RN. Continues to rest in recliner with NAD noted, continues to attempt to drink oral contrast at this time.

## 2019-07-13 NOTE — ED Triage Notes (Signed)
Pt is here with abdominal pain that started last night with vomiting x3. Pt has taken Tylenol & anti-gas meds.

## 2019-07-13 NOTE — Discharge Instructions (Addendum)
Go to the ER for further evaluation and treatment to rule in/out appendicitis.

## 2019-07-13 NOTE — ED Provider Notes (Addendum)
[facility] Emergency Department Provider Note  ____________________________________________   First MD Initiated Contact with Patient 07/13/19 1141     (approximate)  I have reviewed the triage vital signs and the nursing notes.   HISTORY  Chief Complaint Abdominal Pain    HPI Tyler Jacobs is a 34 y.o. male with past medical history of asthma here with right lower quadrant abdominal pain.  The pain began last night.  He began as a mild periumbilical pain has now radiated to the right lower quadrant.  It is aching, gnawing, and worse with any kind of palpation or movement.  He has had associated nausea, vomiting.  He has had some chills.  He has not been able to eat or drink anything since midnight last night.  He went to urgent care who sent him here for further evaluation.  No prior abdominal surgeries.        Past Medical History:  Diagnosis Date  . Asthma     There are no problems to display for this patient.   History reviewed. No pertinent surgical history.  Prior to Admission medications   Medication Sig Start Date End Date Taking? Authorizing Provider  albuterol (PROVENTIL HFA;VENTOLIN HFA) 108 (90 BASE) MCG/ACT inhaler Inhale 1-2 puffs into the lungs every 6 (six) hours as needed for wheezing or shortness of breath.    [provider]  azithromycin (ZITHROMAX) 250 MG tablet Take 2 tabs PO x 1 dose, then 1 tab PO QD x 4 days 12/31/13   Carmelina Dane, MD  naproxen (NAPROSYN) 500 MG tablet Take 1 tablet (500 mg total) by mouth 2 (two) times daily. 04/07/13   Renne Crigler, PA-C  promethazine-codeine (PHENERGAN WITH CODEINE) 6.25-10 MG/5ML syrup Take 5-10 mLs by mouth every 6 (six) hours as needed. 12/31/13   Carmelina Dane, MD    Allergies Patient has no known allergies.  Family History  Problem Relation Age of Onset  . Hypertension Other   . Diabetes Other   . Cancer Mother   . Hypertension Mother     Social  History Social History   Tobacco Use  . Smoking status: Never Smoker  . Smokeless tobacco: Never Used  Substance Use Topics  . Alcohol use: Yes    Comment: social  . Drug use: No    Review of Systems  Review of Systems  Constitutional: Negative for chills, fatigue and fever.  HENT: Negative for sore throat.   Respiratory: Negative for shortness of breath.   Cardiovascular: Negative for chest pain.  Gastrointestinal: Positive for abdominal pain, nausea and vomiting.  Genitourinary: Negative for flank pain.  Musculoskeletal: Negative for neck pain.  Skin: Negative for rash and wound.  Allergic/Immunologic: Negative for immunocompromised state.  Neurological: Negative for weakness and numbness.  Hematological: Does not bruise/bleed easily.     ____________________________________________  PHYSICAL EXAM:      VITAL SIGNS: ED Triage Vitals  Enc Vitals Group     BP 07/13/19 0857 112/65     Pulse Rate 07/13/19 0857 71     Resp 07/13/19 0857 (!) 25     Temp 07/13/19 0857 98.3 F (36.8 C)     Temp Source 07/13/19 0857 Oral     SpO2 07/13/19 0857 98 %     Weight 07/13/19 0856 225 lb (102.1 kg)     Height 07/13/19 0856 5\' 6"  (1.676 m)     Head Circumference --      Peak Flow --  Pain Score 07/13/19 0856 10     Pain Loc --      Pain Edu? --      Excl. in GC? --      Physical Exam Vitals and nursing note reviewed.  Constitutional:      General: He is not in acute distress.    Appearance: He is well-developed.  HENT:     Head: Normocephalic and atraumatic.  Eyes:     Conjunctiva/sclera: Conjunctivae normal.  Cardiovascular:     Rate and Rhythm: Normal rate and regular rhythm.     Heart sounds: Normal heart sounds.  Pulmonary:     Effort: Pulmonary effort is normal. No respiratory distress.     Breath sounds: No wheezing.  Abdominal:     General: There is no distension.     Tenderness: There is abdominal tenderness in the right lower quadrant and  periumbilical area. There is guarding. Positive signs include McBurney's sign.  Musculoskeletal:     Cervical back: Neck supple.  Skin:    General: Skin is warm.     Capillary Refill: Capillary refill takes less than 2 seconds.     Findings: No rash.  Neurological:     Mental Status: He is alert and oriented to person, place, and time.     Motor: No abnormal muscle tone.       ____________________________________________   LABS (all labs ordered are listed, but only abnormal results are displayed)  Labs Reviewed  COMPREHENSIVE METABOLIC PANEL - Abnormal; Notable for the following components:      Result Value   Glucose, Bld 126 (*)    All other components within normal limits  CBC - Abnormal; Notable for the following components:   WBC 11.7 (*)    MCV 79.9 (*)    All other components within normal limits  RESPIRATORY PANEL BY RT PCR (FLU A&B, COVID)  LIPASE, BLOOD  URINALYSIS, COMPLETE (UACMP) WITH MICROSCOPIC  POC URINE PREG, ED    ____________________________________________  EKG: None ________________________________________  RADIOLOGY All imaging, including plain films, CT scans, and ultrasounds, independently reviewed by me, and interpretations confirmed via formal radiology reads.  ED MD interpretation:   CT A/P: acute appendicitis, no perforation  Official radiology report(s): CT ABDOMEN PELVIS W CONTRAST  Result Date: 07/13/2019 CLINICAL DATA:  Right lower abdominal pain with nausea and vomiting EXAM: CT ABDOMEN AND PELVIS WITH CONTRAST TECHNIQUE: Multidetector CT imaging of the abdomen and pelvis was performed using the standard protocol following bolus administration of intravenous contrast. Oral contrast was also administered. CONTRAST:  OMNIPAQUE IOHEXOL 300 MG/ML  SOLN COMPARISON:  None. FINDINGS: Lower chest: Lung bases are clear.  There is a small hiatal hernia. Hepatobiliary: There are scattered subcentimeter cysts in the liver. No other lesions  evident in the liver. The gallbladder wall is not appreciably thickened. There is no biliary duct dilatation. Pancreas: There is no pancreatic mass or inflammatory focus. Spleen: No splenic lesions are evident. Adrenals/Urinary Tract: Adrenals bilaterally appear normal. Kidneys bilaterally show no evident mass or hydronephrosis on either side. There is no evident renal or ureteral calculus on either side. Urinary bladder is midline with wall thickness within normal limits. Stomach/Bowel: There is no appreciable bowel wall or mesenteric thickening. No evident bowel obstruction. Terminal ileum appears normal. There is no evident free air or portal venous air. Vascular/Lymphatic: No abdominal aortic aneurysm. No arterial vascular lesions evident. Major venous structures appear patent. There is no evident adenopathy in the abdomen or pelvis. Reproductive:  The prostate and seminal vesicles are normal in size and contour. There is no evident pelvic mass. Other: The appendix is dilated to 22 mm proximally. Appendix remains dilated but less so more distally, measuring 11 mm more distally. There is enhancement of much of the appendiceal wall. There is soft tissue stranding along the course of the appendix without frank abscess. There is an appendicolith in the base of the appendix measuring 9 x 4 mm. No perforation evident in this area. No abscess or ascites is evident in the abdomen or pelvis. There is slight fat in the umbilicus. Musculoskeletal: There are no blastic or lytic bone lesions. No intramuscular or abdominal wall lesions evident. IMPRESSION: 1.  Findings indicative of acute appendiceal inflammation. Appendix: Location: Appendix arises from the inferolateral cecum, located in the right lower quadrant and upper pelvis laterally on the right. Diameter: 22 mm proximally. Appendicolith: 9 x 4 mm at the base Mucosal hyper-enhancement: Present throughout much of the mid to distal appendix. Extraluminal gas: None  Periappendiceal collection: Soft tissue stranding but only minimal fluid. No abscess. 2.  No bowel obstruction.  No abscess in the abdomen or pelvis. 3.  Small hiatal hernia. Critical Value/emergent results were called by telephone at the time of interpretation on 07/13/2019 at 11:34 am to provider [facility] , who verbally acknowledged these results. Electronically Signed   By: Bretta Bang III M.D.   On: 07/13/2019 11:35    ____________________________________________  PROCEDURES   Procedure(s) performed (including Critical Care):  Procedures  ____________________________________________  INITIAL IMPRESSION / MDM / ASSESSMENT AND PLAN / ED COURSE  As part of my medical decision making, I reviewed the following data within the electronic MEDICAL RECORD NUMBER Nursing notes reviewed and incorporated, Old chart reviewed, Notes from prior ED visits, and [place] Controlled Substance Database       *Tyler Jacobs was evaluated in Emergency Department on 07/13/2019 for the symptoms described in the history of present illness. He was evaluated in the context of the global COVID-19 pandemic, which necessitated consideration that the patient might be at risk for infection with the SARS-CoV-2 virus that causes COVID-19. Institutional protocols and algorithms that pertain to the evaluation of patients at risk for COVID-19 are in a state of rapid change based on information released by regulatory bodies including the CDC and federal and state organizations. These policies and algorithms were followed during the patient's care in the ED.  Some ED evaluations and interventions may be delayed as a result of limited staffing during the pandemic.*     Medical Decision Making:  34 yo M here with RLQ pain due to acute appendicitis. No perforation or signs of sepsis. NPO since MN. COVID test ordered. IVF, Rocephin/Flagyl ordered and will consult  surgery.  ____________________________________________  FINAL CLINICAL IMPRESSION(S) / ED DIAGNOSES  Final diagnoses:  Acute appendicitis with localized peritonitis, without perforation, abscess, or gangrene     MEDICATIONS GIVEN DURING THIS VISIT:  Medications  fentaNYL (SUBLIMAZE) injection 50 mcg (50 mcg Intravenous Given 07/13/19 0908)  iohexol (OMNIPAQUE) 9 MG/ML oral solution 500 mL (has no administration in time range)  cefTRIAXone (ROCEPHIN) 2 g in sodium chloride 0.9 % 100 mL IVPB (has no administration in time range)    And  metroNIDAZOLE (FLAGYL) IVPB 500 mg (has no administration in time range)  0.9 %  sodium chloride infusion (has no administration in time range)  morphine 4 MG/ML injection 4 mg (has no administration in time range)  ondansetron (ZOFRAN) injection  4 mg (has no administration in time range)  iohexol (OMNIPAQUE) 300 MG/ML solution 100 mL (100 mLs Intravenous Contrast Given 07/13/19 1120)     ED Discharge Orders    None       Note:  This document was prepared using Dragon voice recognition software and may include unintentional dictation errors.   Duffy Bruce, MD 07/13/19 1157    Duffy Bruce, MD 07/13/19 1201

## 2019-07-13 NOTE — ED Triage Notes (Signed)
Right sided abdominal pain since last night with nausea and vomiting. Positive for tenderness with palpation and rebound tenderness. Still has appendix.

## 2019-07-13 NOTE — ED Provider Notes (Signed)
Patient in severe abdominal pain, vomiting bile in office. Started last night. Instructed to go to the ER for further evaluation and treatment.   Moshe Cipro, NP 07/13/19 [phone]

## 2019-07-14 ENCOUNTER — Encounter: Payer: Self-pay | Admitting: General Surgery

## 2019-07-14 ENCOUNTER — Observation Stay: Payer: BC Managed Care – PPO | Admitting: Anesthesiology

## 2019-07-14 ENCOUNTER — Encounter
Admission: EM | Disposition: A | Discharge status: Home / Self Care | Payer: Self-pay | Source: Home / Self Care | Attending: Emergency Medicine

## 2019-07-14 DIAGNOSIS — K353 Acute appendicitis with localized peritonitis, without perforation or gangrene: Secondary | ICD-10-CM | POA: Diagnosis not present

## 2019-07-14 HISTORY — PX: LAPAROSCOPIC APPENDECTOMY: SHX408

## 2019-07-14 LAB — URINALYSIS, COMPLETE (UACMP) WITH MICROSCOPIC
Bacteria, UA: NONE SEEN
Bilirubin Urine: NEGATIVE
Glucose, UA: NEGATIVE mg/dL
Hgb urine dipstick: NEGATIVE
Ketones, ur: NEGATIVE mg/dL
Leukocytes,Ua: NEGATIVE
Nitrite: NEGATIVE
Protein, ur: 30 mg/dL — AB
Specific Gravity, Urine: >1.046 — ABNORMAL HIGH (ref 1.005–1.030)
pH: 6 (ref 5.0–8.0)

## 2019-07-14 LAB — CBC
HCT: 41.9 % (ref 39.0–52.0)
Hemoglobin: 14 g/dL (ref 13.0–17.0)
MCH: 28.1 pg (ref 26.0–34.0)
MCHC: 33.4 g/dL (ref 30.0–36.0)
MCV: 84.1 fL (ref 80.0–100.0)
Platelets: 150 10*3/uL (ref 150–400)
RBC: 4.98 MIL/uL (ref 4.22–5.81)
RDW: 13.8 % (ref 11.5–15.5)
WBC: 11.4 10*3/uL — ABNORMAL HIGH (ref 4.0–10.5)
nRBC: 0 % (ref 0.0–0.2)

## 2019-07-14 LAB — COMPREHENSIVE METABOLIC PANEL
ALT: 26 U/L (ref 0–44)
AST: 21 U/L (ref 15–41)
Albumin: 3.8 g/dL (ref 3.5–5.0)
Alkaline Phosphatase: 44 U/L (ref 38–126)
Anion gap: 6 (ref 5–15)
BUN: 11 mg/dL (ref 6–20)
CO2: 29 mmol/L (ref 22–32)
Calcium: 8.3 mg/dL — ABNORMAL LOW (ref 8.9–10.3)
Chloride: 104 mmol/L (ref 98–111)
Creatinine, Ser: 1.08 mg/dL (ref 0.61–1.24)
GFR calc Af Amer: >60 mL/min (ref >60)
GFR calc non Af Amer: >60 mL/min (ref >60)
Glucose, Bld: 98 mg/dL (ref 70–99)
Potassium: 3.6 mmol/L (ref 3.5–5.1)
Sodium: 139 mmol/L (ref 135–145)
Total Bilirubin: 1.3 mg/dL — ABNORMAL HIGH (ref 0.3–1.2)
Total Protein: 6.8 g/dL (ref 6.5–8.1)

## 2019-07-14 LAB — SURGICAL PCR SCREEN
MRSA, PCR: NEGATIVE
Staphylococcus aureus: POSITIVE — AB

## 2019-07-14 LAB — HIV ANTIBODY (ROUTINE TESTING W REFLEX): HIV Screen 4th Generation wRfx: NONREACTIVE

## 2019-07-14 SURGERY — APPENDECTOMY, LAPAROSCOPIC
Anesthesia: General

## 2019-07-14 MED ORDER — MIDAZOLAM HCL 2 MG/2ML IJ SOLN
INTRAMUSCULAR | Status: AC
  Filled 2019-07-14: qty 2

## 2019-07-14 MED ORDER — LIDOCAINE HCL (CARDIAC) PF 100 MG/5ML IV SOSY
PREFILLED_SYRINGE | INTRAVENOUS | Status: DC | PRN
  Administered 2019-07-14: 100 mg via INTRAVENOUS

## 2019-07-14 MED ORDER — PROPOFOL 500 MG/50ML IV EMUL
INTRAVENOUS | Status: AC
  Filled 2019-07-14: qty 50

## 2019-07-14 MED ORDER — FENTANYL CITRATE (PF) 100 MCG/2ML IJ SOLN
INTRAMUSCULAR | Status: AC
  Filled 2019-07-14: qty 2

## 2019-07-14 MED ORDER — SODIUM CHLORIDE 0.9 % IV SOLN: INTRAVENOUS | Status: DC

## 2019-07-14 MED ORDER — HEMOSTATIC AGENTS (NO CHARGE) OPTIME
TOPICAL | Status: DC | PRN
  Administered 2019-07-14: 1 via TOPICAL

## 2019-07-14 MED ORDER — SODIUM CHLORIDE 0.9 % IR SOLN
Status: DC | PRN
  Administered 2019-07-14: 200 mL

## 2019-07-14 MED ORDER — SUCCINYLCHOLINE CHLORIDE 200 MG/10ML IV SOSY
PREFILLED_SYRINGE | INTRAVENOUS | Status: AC
  Filled 2019-07-14: qty 10

## 2019-07-14 MED ORDER — FENTANYL CITRATE (PF) 100 MCG/2ML IJ SOLN
INTRAMUSCULAR | Status: DC | PRN
  Administered 2019-07-14 (×2): 50 ug via INTRAVENOUS

## 2019-07-14 MED ORDER — DEXMEDETOMIDINE HCL IN NACL 200 MCG/50ML IV SOLN
INTRAVENOUS | Status: DC | PRN
  Administered 2019-07-14: 8 ug via INTRAVENOUS

## 2019-07-14 MED ORDER — LIDOCAINE-EPINEPHRINE 1 %-1:100000 IJ SOLN
INTRAMUSCULAR | Status: DC | PRN
  Administered 2019-07-14: 10 mL via SUBCUTANEOUS

## 2019-07-14 MED ORDER — LACTATED RINGERS IV SOLN: INTRAVENOUS | Status: DC | PRN

## 2019-07-14 MED ORDER — ONDANSETRON HCL 4 MG/2ML IJ SOLN: 4.0000 mg | Freq: Once | INTRAMUSCULAR | Status: DC | PRN

## 2019-07-14 MED ORDER — KETOROLAC TROMETHAMINE 30 MG/ML IJ SOLN
INTRAMUSCULAR | Status: AC
  Filled 2019-07-14: qty 1

## 2019-07-14 MED ORDER — PROPOFOL 10 MG/ML IV BOLUS
INTRAVENOUS | Status: DC | PRN
  Administered 2019-07-14: 200 mg via INTRAVENOUS

## 2019-07-14 MED ORDER — DEXAMETHASONE SODIUM PHOSPHATE 10 MG/ML IJ SOLN
INTRAMUSCULAR | Status: DC | PRN
  Administered 2019-07-14: 10 mg via INTRAVENOUS

## 2019-07-14 MED ORDER — SEVOFLURANE IN SOLN
RESPIRATORY_TRACT | Status: AC
  Filled 2019-07-14: qty 250

## 2019-07-14 MED ORDER — PHENYLEPHRINE HCL (PRESSORS) 10 MG/ML IV SOLN
INTRAVENOUS | Status: DC | PRN
  Administered 2019-07-14 (×2): 100 ug via INTRAVENOUS

## 2019-07-14 MED ORDER — ROCURONIUM BROMIDE 100 MG/10ML IV SOLN
INTRAVENOUS | Status: DC | PRN
  Administered 2019-07-14: 60 mg via INTRAVENOUS
  Administered 2019-07-14: 20 mg via INTRAVENOUS

## 2019-07-14 MED ORDER — MUPIROCIN 2 % EX OINT
1.0000 "application " | TOPICAL_OINTMENT | Freq: Two times a day (BID) | CUTANEOUS | Status: DC
  Administered 2019-07-14 – 2019-07-15 (×2): 1 via NASAL
  Filled 2019-07-14: qty 22

## 2019-07-14 MED ORDER — SUGAMMADEX SODIUM 200 MG/2ML IV SOLN
INTRAVENOUS | Status: DC | PRN
  Administered 2019-07-14: 400 mg via INTRAVENOUS

## 2019-07-14 MED ORDER — SUCCINYLCHOLINE CHLORIDE 20 MG/ML IJ SOLN
INTRAMUSCULAR | Status: DC | PRN
  Administered 2019-07-14: 50 mg via INTRAVENOUS

## 2019-07-14 MED ORDER — FENTANYL CITRATE (PF) 100 MCG/2ML IJ SOLN: 25.0000 ug | INTRAMUSCULAR | Status: DC | PRN

## 2019-07-14 MED ORDER — LIDOCAINE HCL (PF) 2 % IJ SOLN
INTRAMUSCULAR | Status: AC
  Filled 2019-07-14: qty 10

## 2019-07-14 MED ORDER — ONDANSETRON HCL 4 MG/2ML IJ SOLN
INTRAMUSCULAR | Status: AC
  Filled 2019-07-14: qty 2

## 2019-07-14 MED ORDER — MIDAZOLAM HCL 2 MG/2ML IJ SOLN
INTRAMUSCULAR | Status: DC | PRN
  Administered 2019-07-14: 2 mg via INTRAVENOUS

## 2019-07-14 MED ORDER — "VISTASEAL 4 ML SINGLE DOSE KIT "
PACK | CUTANEOUS | Status: DC | PRN
  Administered 2019-07-14: 4 mL via TOPICAL

## 2019-07-14 MED ORDER — ROCURONIUM BROMIDE 10 MG/ML (PF) SYRINGE
PREFILLED_SYRINGE | INTRAVENOUS | Status: AC
  Filled 2019-07-14: qty 10

## 2019-07-14 MED ORDER — OXYCODONE HCL 5 MG PO TABS
5.0000 mg | ORAL_TABLET | ORAL | Status: DC | PRN
  Filled 2019-07-14: qty 1

## 2019-07-14 MED ORDER — IBUPROFEN 600 MG PO TABS
600.0000 mg | ORAL_TABLET | ORAL | Status: DC | PRN
  Administered 2019-07-14 (×2): 600 mg via ORAL
  Filled 2019-07-14 (×4): qty 1

## 2019-07-14 SURGICAL SUPPLY — 62 items
ADH SKN CLS APL DERMABOND .7 (GAUZE/BANDAGES/DRESSINGS) ×1
APL LAPSCP 35 DL APL RGD (MISCELLANEOUS) ×1
APL PRP STRL LF DISP 70% ISPRP (MISCELLANEOUS) ×1
APL SWBSTK 6 STRL LF DISP (MISCELLANEOUS)
APPLICATOR COTTON TIP 6 STRL (MISCELLANEOUS) ×1 IMPLANT
APPLICATOR COTTON TIP 6IN STRL (MISCELLANEOUS)
APPLICATOR VISTASEAL 35 (MISCELLANEOUS) ×1 IMPLANT
APPLIER CLIP 5 13 M/L LIGAMAX5 (MISCELLANEOUS)
APR CLP MED LRG 5 ANG JAW (MISCELLANEOUS)
BAG SPEC RTRVL LRG 6X4 10 (ENDOMECHANICALS) ×1
BLADE CLIPPER SURG (BLADE) ×1 IMPLANT
BLADE SURG SZ11 CARB STEEL (BLADE) ×2 IMPLANT
CANISTER SUCT 1200ML W/VALVE (MISCELLANEOUS) ×2 IMPLANT
CHLORAPREP W/TINT 26 (MISCELLANEOUS) ×2 IMPLANT
CLIP APPLIE 5 13 M/L LIGAMAX5 (MISCELLANEOUS) IMPLANT
COVER WAND RF STERILE (DRAPES) ×2 IMPLANT
CUTTER FLEX LINEAR 45M (STAPLE) ×2 IMPLANT
DEFOGGER SCOPE WARMER CLEARIFY (MISCELLANEOUS) ×2 IMPLANT
DERMABOND ADVANCED (GAUZE/BANDAGES/DRESSINGS) ×1
DERMABOND ADVANCED .7 DNX12 (GAUZE/BANDAGES/DRESSINGS) ×1 IMPLANT
ELECT CAUTERY BLADE 6.4 (BLADE) ×1 IMPLANT
ELECT CAUTERY BLADE TIP 2.5 (TIP) ×2
ELECT REM PT RETURN 9FT ADLT (ELECTROSURGICAL) ×2
ELECTRODE CAUTERY BLDE TIP 2.5 (TIP) ×1 IMPLANT
ELECTRODE REM PT RTRN 9FT ADLT (ELECTROSURGICAL) ×1 IMPLANT
GLOVE BIO SURGEON STRL SZ 6.5 (GLOVE) ×3 IMPLANT
GLOVE INDICATOR 7.0 STRL GRN (GLOVE) ×4 IMPLANT
GOWN STRL REUS W/ TWL LRG LVL3 (GOWN DISPOSABLE) ×2 IMPLANT
GOWN STRL REUS W/TWL LRG LVL3 (GOWN DISPOSABLE) ×4
GRASPER SUT TROCAR 14GX15 (MISCELLANEOUS) ×2 IMPLANT
HEMOSTAT SURGICEL 2X3 (HEMOSTASIS) ×1 IMPLANT
IRRIGATION STRYKERFLOW (MISCELLANEOUS) ×1 IMPLANT
IRRIGATOR STRYKERFLOW (MISCELLANEOUS) ×2
IV NS 1000ML (IV SOLUTION) ×2
IV NS 1000ML BAXH (IV SOLUTION) ×1 IMPLANT
KIT TURNOVER KIT A (KITS) ×2 IMPLANT
KITTNER LAPARASCOPIC 5X40 (MISCELLANEOUS) ×2 IMPLANT
LABEL OR SOLS (LABEL) ×2 IMPLANT
LIGASURE LAP MARYLAND 5MM 37CM (ELECTROSURGICAL) IMPLANT
NEEDLE HYPO 22GX1.5 SAFETY (NEEDLE) ×2 IMPLANT
NS IRRIG 500ML POUR BTL (IV SOLUTION) ×2 IMPLANT
PACK LAP CHOLECYSTECTOMY (MISCELLANEOUS) ×2 IMPLANT
PENCIL ELECTRO HAND CTR (MISCELLANEOUS) ×2 IMPLANT
POUCH SPECIMEN RETRIEVAL 10MM (ENDOMECHANICALS) ×2 IMPLANT
RELOAD STAPLE 45 3.5 BLU ETS (ENDOMECHANICALS) ×1 IMPLANT
RELOAD STAPLE TA45 3.5 REG BLU (ENDOMECHANICALS) ×2 IMPLANT
SCISSORS METZENBAUM CVD 33 (INSTRUMENTS) ×2 IMPLANT
SET TUBE SMOKE EVAC HIGH FLOW (TUBING) ×2 IMPLANT
SHEARS HARMONIC ACE PLUS 36CM (ENDOMECHANICALS) ×2 IMPLANT
SLEEVE ADV FIXATION 5X100MM (TROCAR) ×3 IMPLANT
STRIP CLOSURE SKIN 1/2X4 (GAUZE/BANDAGES/DRESSINGS) ×2 IMPLANT
SUT MNCRL 4-0 (SUTURE) ×2
SUT MNCRL 4-0 27XMFL (SUTURE) ×1
SUT VIC AB 3-0 SH 27 (SUTURE) ×2
SUT VIC AB 3-0 SH 27X BRD (SUTURE) ×1 IMPLANT
SUT VICRYL 0 AB UR-6 (SUTURE) ×2 IMPLANT
SUTURE MNCRL 4-0 27XMF (SUTURE) ×1 IMPLANT
SYS KII FIOS ACCESS ABD 5X100 (TROCAR) ×2
SYSTEM KII FIOS ACES ABD 5X100 (TROCAR) ×1 IMPLANT
TRAY FOLEY MTR SLVR 16FR STAT (SET/KITS/TRAYS/PACK) ×2 IMPLANT
TROCAR 130MM GELPORT  DAV (MISCELLANEOUS) ×2 IMPLANT
TROCAR ADV FIXATION 12X100MM (TROCAR) IMPLANT

## 2019-07-14 NOTE — Anesthesia Postprocedure Evaluation (Signed)
Anesthesia Post Note  Patient: Tyler Jacobs  Procedure(s) Performed: APPENDECTOMY LAPAROSCOPIC (N/A )  Patient location during evaluation: PACU Anesthesia Type: General Level of consciousness: awake, awake and alert and oriented Pain management: pain level controlled Vital Signs Assessment: vitals unstable and post-procedure vital signs reviewed and stable Respiratory status: spontaneous breathing Cardiovascular status: blood pressure returned to baseline Postop Assessment: no headache     Last Vitals:  Vitals:   07/14/19 0539 07/14/19 1116  BP: (!) 94/53 110/68  Pulse: 78   Resp: 16 19  Temp: 36.7 C 36.5 C  SpO2: 99%     Last Pain:  Vitals:   07/14/19 1116  TempSrc:   PainSc: Asleep                 Lyla Son K Malak Duchesneau

## 2019-07-14 NOTE — Op Note (Signed)
Operative Note  Laparoscopic Appendectomy   Tyler Jacobs Date of operation:  07/14/2019  Indications: The patient presented with a history of  abdominal pain. Workup has revealed findings consistent with acute appendicitis.  Pre-operative Diagnosis: Appendicitis, unqualified  Post-operative Diagnosis: Same  Surgeon: Duanne Guess, MD  Anesthesia: GETA  Findings: The appendix had been isolated from the rest of the abdomen by fat and the cecum.  There was significant localized inflammation and peritonitis, but no evidence of perforation.  No pus.  No generalized peritonitis.  Estimated Blood Loss: Less than 10 cc         Specimens: appendix         Complications: None immediately apparent  Procedure Details  The patient was seen again in the preop area. The options of surgery versus observation were reviewed with the patient and/or family. The risks of bleeding, infection, recurrence of symptoms, negative laparoscopy, potential for an open procedure, bowel injury, abscess or infection, were all reviewed as well. The patient was taken to Operating Room, identified as Tyler Jacobs and the procedure verified as laparoscopic appendectomy. A time out was performed and the above information confirmed.  The patient was placed in the supine position and general anesthesia was induced.  Antibiotic prophylaxis was not administered as he had been receiving antibiotics preoperatively and VTE prophylaxis was in place. A Foley catheter was placed by the nursing staff.   The abdomen was prepped and draped in a sterile fashion.  A supra umbilical incision was made. A cutdown technique was used to enter the abdominal cavity. Two vicryl stitches were placed on the fascia and a Hasson trocar inserted. Pneumoperitoneum obtained. Two 5 mm ports were placed under direct visualization.  The appendix was identified and found to be acutely inflamed and walled off behind the cecum.  The surrounding tissues  were also acutely inflamed. The appendix was carefully dissected. The mesoappendix was divided with the Harmonic scalpel. The base of the appendix was dissected out and divided with a standard load Endo GIA.The appendix was placed in a Endo pouch bag and removed via the Hasson port. The right lower quadrant and pelvis was then irrigated with normal saline which was then aspirated. The right lower quadrant was inspected there was no sign of bleeding, aside from a small amount of oozing from the staple line, which was treated with application of Surgicel and Vistaseal.  There was no identified bowel injury therefore pneumoperitoneum was released, all ports were removed.  The umbilical fascia was closed with 0 Vicryl interrupted sutures and the skin incisions were approximated with subcuticular 4-0 Monocryl. Dermabond was applied The patient tolerated the procedure well and there were no immediately apparent complications . The sponge lap and needle count were correct at the end of the procedure.  The patient was taken to the recovery room in stable condition to be admitted for continued care.   Duanne Guess, MD, FACS

## 2019-07-14 NOTE — Anesthesia Procedure Notes (Signed)
Date/Time: 07/14/2019 10:04 AM Performed by: Raeanne Barry, CRNA

## 2019-07-14 NOTE — Anesthesia Procedure Notes (Signed)
Procedure Name: Intubation Date/Time: 07/14/2019 9:45 AM Performed by: Lawana Hartzell, Dierdre Forth, CRNA Pre-anesthesia Checklist: Patient identified, Emergency Drugs available, Suction available, Patient being monitored and Timeout performed Patient Re-evaluated:Patient Re-evaluated prior to induction Oxygen Delivery Method: Circle system utilized Preoxygenation: Pre-oxygenation with 100% oxygen Induction Type: IV induction Ventilation: Mask ventilation without difficulty Laryngoscope Size: McGraph and 4 (First attempt with Mac 4 grade 3 view second with mcgraph with grade 1 view) Grade View: Grade I

## 2019-07-14 NOTE — Progress Notes (Signed)
15 minute call to floor. 

## 2019-07-14 NOTE — Transfer of Care (Signed)
Immediate Anesthesia Transfer of Care Note  Patient: Tyler Jacobs  Procedure(s) Performed: APPENDECTOMY LAPAROSCOPIC (N/A )  Patient Location: PACU  Anesthesia Type:General  Level of Consciousness: awake, alert  and oriented  Airway & Oxygen Therapy: Patient Spontanous Breathing  Post-op Assessment: Report given to RN  Post vital signs: Reviewed and stable  Last Vitals:  Vitals Value Taken Time  BP 110/68 07/14/19 1116  Temp 36.5 C 07/14/19 1116  Pulse 88 07/14/19 1122  Resp 23 07/14/19 1122  SpO2 100 % 07/14/19 1122  Vitals shown include unvalidated device data.  Last Pain:  Vitals:   07/14/19 1116  TempSrc:   PainSc: Asleep      Patients Stated Pain Goal: 0 (07/14/19 0544)  Complications: No apparent anesthesia complications

## 2019-07-15 MED ORDER — ACETAMINOPHEN 500 MG PO TABS: 1000.0000 mg | ORAL_TABLET | Freq: Four times a day (QID) | ORAL | 0 refills | Status: AC

## 2019-07-15 MED ORDER — IBUPROFEN 600 MG PO TABS: 600.0000 mg | ORAL_TABLET | ORAL | 0 refills | Status: AC | PRN

## 2019-07-15 MED ORDER — OXYCODONE HCL 5 MG PO TABS: 5.0000 mg | ORAL_TABLET | ORAL | 0 refills | Status: DC | PRN

## 2019-07-15 MED ORDER — MUPIROCIN 2 % EX OINT: 1.0000 "application " | TOPICAL_OINTMENT | Freq: Two times a day (BID) | CUTANEOUS | 0 refills | Status: DC

## 2019-07-15 NOTE — Progress Notes (Signed)
Tyler Jacobs to be D/C'd Home per MD order.  Discussed prescriptions and follow up appointments with the patient. Prescriptions given to patient, medication list explained in detail. Pt verbalized understanding.  Allergies as of 07/15/2019   No Known Allergies     Medication List    TAKE these medications   acetaminophen 500 MG tablet Commonly known as: TYLENOL Take 2 tablets (1,000 mg total) by mouth every 6 (six) hours.   albuterol 108 (90 Base) MCG/ACT inhaler Commonly known as: VENTOLIN HFA Inhale 1-2 puffs into the lungs every 6 (six) hours as needed for wheezing or shortness of breath.   ibuprofen 600 MG tablet Commonly known as: ADVIL Take 1 tablet (600 mg total) by mouth every 4 (four) hours as needed for fever, headache, moderate pain or cramping.   mupirocin ointment 2 % Commonly known as: BACTROBAN Place 1 application into the nose 2 (two) times daily.   oxyCODONE 5 MG immediate release tablet Commonly known as: Oxy IR/ROXICODONE Take 1 tablet (5 mg total) by mouth every 4 (four) hours as needed for severe pain or breakthrough pain.       Vitals:   07/15/19 0429 07/15/19 1211  BP: 100/63 127/79  Pulse: 81 85  Resp: 16 16  Temp: 98.3 F (36.8 C) 98.2 F (36.8 C)  SpO2: 99% 98%    Skin clean, dry and intact without evidence of skin break down, no evidence of skin tears noted. IV catheter discontinued intact. Site without signs and symptoms of complications. Dressing and pressure applied. Pt denies pain at this time. No complaints noted.  An After Visit Summary was printed and given to the patient. Patient escorted via WC, and D/C home via private auto.  Madie Reno, RN

## 2019-07-15 NOTE — Discharge Summary (Signed)
Physician Discharge Summary  Patient ID: Tyler Jacobs MRN: [id number] DOB/AGE: April 09, 1986 34 y.o.  Admit date: 07/13/2019 Discharge date: 07/15/2019  Admission Diagnoses: Acute appendicitis  Discharge Diagnoses:  Active Problems:   Acute appendicitis   Discharged Condition: good  Hospital Course: The patient presented to the hospital on Friday, July 13, 2019 with abdominal pain.  Evaluation in the emergency department was consistent with acute appendicitis.  He was taken to the operating room and underwent an uncomplicated laparoscopic appendectomy on 14 July 2019.  Postoperatively, he did well.  His diet was advanced and he had adequate pain control with oral agents.  He was felt suitable for discharge to home.  Consults: None  Significant Diagnostic Studies: labs: Elevated white blood cell count and radiology: CT scan: Consistent with acute appendicitis  Treatments: IV hydration, antibiotics: ceftriaxone and metronidazole, analgesia: multimodal pain control and surgery: Laparoscopic appendectomy  Discharge Exam: Blood pressure 127/79, pulse 85, temperature 98.2 F (36.8 C), temperature source Oral, resp. rate 16, height 5\' 6"  (1.676 m), weight 102.1 kg, SpO2 98 %. General appearance: alert and no distress Resp: Normal work of breathing on room air Cardio: regular rate and rhythm GI: Soft, appropriately tender Incision/Wound: Laparoscopic port sites dressed with Steri-Strips.  No erythema, induration or drainage present.  Disposition: Discharge disposition: 01-Home or Self Care       Discharge Instructions    Call MD for:  difficulty breathing, headache or visual disturbances   Complete by: As directed    Call MD for:  extreme fatigue   Complete by: As directed    Call MD for:  hives   Complete by: As directed    Call MD for:  persistant dizziness or light-headedness   Complete by: As directed    Call MD for:  persistant nausea and vomiting   Complete by: As  directed    Call MD for:  redness, tenderness, or signs of infection (pain, swelling, redness, odor or green/yellow discharge around incision site)   Complete by: As directed    Call MD for:  severe uncontrolled pain   Complete by: As directed    Call MD for:  temperature >100.4   Complete by: As directed    Diet - low sodium heart healthy   Complete by: As directed    Driving Restrictions   Complete by: As directed    No driving for 1 week or while taking narcotic pain medications.   Increase activity slowly   Complete by: As directed    Leave dressing on - Keep it clean, dry, and intact until clinic visit   Complete by: As directed    You may shower starting tomorrow (Monday).  Do not submerge the incisions or allow them to become saturated.  It is okay if they get a little bit damp; just pat them dry with a clean soft towel.  Allow Steri-Strips (paper tapes) to fall off on their own.   Lifting restrictions   Complete by: As directed    No lifting anything heavier than 10 pounds for 3 weeks.     Allergies as of 07/15/2019   No Known Allergies     Medication List    TAKE these medications   acetaminophen 500 MG tablet Commonly known as: TYLENOL Take 2 tablets (1,000 mg total) by mouth every 6 (six) hours.   albuterol 108 (90 Base) MCG/ACT inhaler Commonly known as: VENTOLIN HFA Inhale 1-2 puffs into the lungs every 6 (six) hours as needed for  wheezing or shortness of breath.   ibuprofen 600 MG tablet Commonly known as: ADVIL Take 1 tablet (600 mg total) by mouth every 4 (four) hours as needed for fever, headache, moderate pain or cramping.   mupirocin ointment 2 % Commonly known as: BACTROBAN Place 1 application into the nose 2 (two) times daily.   oxyCODONE 5 MG immediate release tablet Commonly known as: Oxy IR/ROXICODONE Take 1 tablet (5 mg total) by mouth every 4 (four) hours as needed for severe pain or breakthrough pain.      Follow-up Information    Duanne Guess, MD. Schedule an appointment as soon as possible for a visit in 3 week(s).   Specialty: General Surgery Why: Okay for virtual or telephone visit if desired. Contact information: [address] [place] [address] [phone]           Signed: Duanne Guess 07/15/2019, 1:47 PM

## 2019-07-15 NOTE — Discharge Instructions (Signed)
Laparoscopic Appendectomy, Adult, Care After This sheet gives you information about how to care for yourself after your procedure. Your health care provider may also give you more specific instructions. If you have problems or questions, contact your health care provider. What can I expect after the procedure? After the procedure, it is common to have:  Little energy for normal activities.  Mild pain in the area where the incisions were made.  Difficulty passing stool (constipation). This can be caused by: ? Pain medicine. ? A decrease in your activity. Follow these instructions at home: Medicines  Take over-the-counter and prescription medicines only as told by your health care provider.  If you were prescribed an antibiotic medicine, take it as told by your health care provider. Do not stop taking the antibiotic even if you start to feel better.  Do not drive or use heavy machinery while taking prescription pain medicine.  Ask your health care provider if the medicine prescribed to you can cause constipation. You may need to take steps to prevent or treat constipation, such as: ? Drink enough fluid to keep your urine pale yellow. ? Take over-the-counter or prescription medicines. ? Eat foods that are high in fiber, such as beans, whole grains, and fresh fruits and vegetables. ? Limit foods that are high in fat and processed sugars, such as fried or sweet foods. Incision care   Follow instructions from your health care provider about how to take care of your incisions. Make sure you: ? Wash your hands with soap and water before and after you change your bandage (dressing). If soap and water are not available, use hand sanitizer. ? Change your dressing as told by your health care provider. ? Leave stitches (sutures), skin glue, or adhesive strips in place. These skin closures may need to stay in place for 2 weeks or longer. If adhesive strip edges start to loosen and curl up, you  may trim the loose edges. Do not remove adhesive strips completely unless your health care provider tells you to do that.  Check your incision areas every day for signs of infection. Check for: ? Redness, swelling, or pain. ? Fluid or blood. ? Warmth. ? Pus or a bad smell. Bathing  Keep your incisions clean and dry. Clean them as often as told by your health care provider. To do this: 1. Gently wash the incisions with soap and water. 2. Rinse the incisions with water to remove all soap. 3. Pat the incisions dry with a clean towel. Do not rub the incisions.  Do not take baths, swim, or use a hot tub for 2 weeks, or until your health care provider approves. You may take showers after 48 hours. Activity   Do not drive for 24 hours if you were given a sedative during your procedure.  Rest after the procedure. Return to your normal activities as told by your health care provider. Ask your health care provider what activities are safe for you.  For 3 weeks, or for as long as told by your health care provider: ? Do not lift anything that is heavier than 10 lb (4.5 kg), or the limit that you are told. ? Do not play contact sports. General instructions  If you were sent home with a drain, follow instructions from your health care provider about how to care for it.  Take deep breaths. This helps to prevent your lungs from developing an infection (pneumonia).  Keep all follow-up visits as told by your  health care provider. This is important. Contact a health care provider if:  You have redness, swelling, or pain around an incision.  You have fluid or blood coming from an incision.  Your incision feels warm to the touch.  You have pus or a bad smell coming from an incision or dressing.  Your incision edges break open after your sutures have been removed.  You have increasing pain in your shoulders.  You feel dizzy or you faint.  You develop shortness of breath.  You keep feeling  nauseous or you are vomiting.  You have diarrhea or you cannot control your bowel functions.  You lose your appetite.  You develop swelling or pain in your legs.  You develop a rash. Get help right away if you have:  A fever.  Difficulty breathing.  Sharp pains in your chest. Summary  After a laparoscopic appendectomy, it is common to have little energy for normal activities, mild pain in the area of the incisions, and constipation.  Infection is the most common complication after this procedure. Follow your health care provider's instructions about caring for yourself after the procedure.  Rest after the procedure. Return to your normal activities as told by your health care provider.  Contact your health care provider if you notice signs of infection around your incisions or you develop shortness of breath. Get help right away if you have a fever, chest pain, or difficulty breathing. This information is not intended to replace advice given to you by your health care provider. Make sure you discuss any questions you have with your health care provider. Document Revised: 10/13/2017 Document Reviewed: 10/13/2017 Elsevier Patient Education  2020 [employer] Laparoscopic Appendectomy, Adult  A laparoscopic appendectomy is a surgery to take out the appendix. The appendix is a finger-like structure that is attached to the large intestine. In this surgery, the appendix is removed through three small incisions with the help of a thin, lighted tube that has a camera (laparoscope). This procedure may be done to prevent an inflamed appendix from bursting (rupturing). It may also be done to treat the infection from an appendix that has already ruptured. It is usually done right after inflammation of the appendix (appendicitis) is diagnosed. This is a minimally invasive surgery. It usually results in less pain, fewer problems, and a quicker recovery than surgery done through a large  incision. Tell a health care provider about:  Any allergies you have.  All medicines you are taking, including vitamins, herbs, eye drops, creams, and over-the-counter medicines.  Use of steroids (by mouth or creams).  Any problems you or family members have had with anesthetic medicines.  Any blood disorders you have.  Any surgeries you have had.  Any medical conditions you have.  Whether you are pregnant or may be pregnant. What are the risks? Generally, this is a safe procedure. However, problems may occur, including:  Infection.  Bleeding.  Allergic reactions to medicines.  Damage to other structures or organs.  The formation of pus (abscesses).  Long-lasting pain or scarring at the incision sites or inside the abdomen.  Blood clots in the legs. What happens before the procedure? Eating and drinking restrictions Follow instructions from your health care provider about eating and drinking restrictions. You may be asked not to eat or drink as soon as the diagnosis of appendicitis is made. Medicines Ask your health care provider about:  Changing or stopping your regular medicines. This is especially important if you are taking diabetes  medicines or blood thinners.  Taking medicines such as aspirin and ibuprofen. These medicines can thin your blood. Do not take these medicines unless your health care provider tells you to take them.  Taking over-the-counter medicines, vitamins, herbs, and supplements. General instructions  Plan to have someone take you home from the hospital.  If you will be going home right after the procedure, plan to have someone with you for 24 hours.  You may be given antibiotic medicine to help prevent infection or to treat existing inflammation or infection.  Ask your health care provider how your surgical site will be marked or identified.  Ask your health care provider what steps will be taken to help prevent infection. These may  include: ? Removing hair at the surgery site. ? Washing skin with a germ-killing soap. ? Taking antibiotic medicine. What happens during the procedure?   An IV will be inserted into one of your veins.  You will be given one or more of the following: ? A medicine to help you relax (sedative). ? A medicine to numb the area (local anesthetic). ? A medicine to make you fall asleep (general anesthetic).  A thin, flexible tube (catheter) may be put into your bladder to drain urine.  A tube may be passed through your nose and into your stomach (NG tube, or nasogastric tube) to drain any stomach contents.  Your surgeon will make three small incisions near your belly button (navel).  Air-like gas will be used to fill your abdomen. The gas will make your abdomen expand. This helps the surgeon see clearly and gives him or her more room to work.  A laparoscope will be passed through one of the incisions.  Other long, thin surgical instruments will be passed through the other incisions.  The appendix will be located and removed through one of the incisions.  The abdomen may be washed out to remove bacteria.  The incisions will be closed with stitches (sutures), staples, or adhesive strips.  A bandage (dressing) may be used to cover the incisions.  If a tube was inserted into your bladder or stomach, it will be removed. The procedure may vary among health care providers and hospitals. What happens after the procedure?  Your blood pressure, heart rate, breathing rate, and blood oxygen level will be monitored until you leave the hospital.  You will be given medicines as needed to control pain.  Do not drive for 24 hours if you were given a sedative during your procedure.  If your appendix did not rupture, you may be able to go home the same day after your surgery.  If your appendix ruptured: ? You will get antibiotic medicine through an IV line. ? You may be sent home with a temporary  drain. Summary  A laparoscopic appendectomy is a surgery to take out the appendix. The appendix is removed through three small incisions with the help of a thin, lighted tube that has a camera.  This is a safe procedure, but there are some risks, including bleeding, infection, allergic reaction to medicines, or damage to other organs.  You may be asked not to eat or drink as soon as a diagnosis of appendicitis is made.  After the procedure, your blood pressure, heart rate, breathing rate, and blood oxygen level will be monitored until you leave the hospital. This information is not intended to replace advice given to you by your health care provider. Make sure you discuss any questions you have with your  health care provider. Document Revised: 10/13/2017 Document Reviewed: 10/13/2017 Elsevier Patient Education  2020 [employer].

## 2019-07-17 ENCOUNTER — Telehealth: Payer: Self-pay | Admitting: General Surgery

## 2019-07-17 LAB — SURGICAL PATHOLOGY

## 2019-07-17 NOTE — Telephone Encounter (Signed)
Patient is calling and is asking for a doctors note for work, patient had surgery with Dr.Cannon on 07/14/2019. Please call patient and advise. Patient said if we could fax it.

## 2019-07-17 NOTE — Telephone Encounter (Signed)
Patient would like to return to work on 07/23/19 with lifting restrictions until 08/27/19. 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

## 2019-07-26 DIAGNOSIS — U071 COVID-19: Secondary | ICD-10-CM

## 2019-07-26 HISTORY — DX: COVID-19: U07.1

## 2019-08-07 ENCOUNTER — Other Ambulatory Visit: Payer: Self-pay

## 2019-08-07 ENCOUNTER — Telehealth (INDEPENDENT_AMBULATORY_CARE_PROVIDER_SITE_OTHER): Payer: BC Managed Care – PPO | Admitting: General Surgery

## 2019-08-07 DIAGNOSIS — Z09 Encounter for follow-up examination after completed treatment for conditions other than malignant neoplasm: Secondary | ICD-10-CM

## 2019-08-07 NOTE — Progress Notes (Signed)
Virtual Visit via Telephone Note  I connected with Tyler Jacobs on 08/07/19 at  2:30 PM EDT by telephone and verified that I am speaking with the correct person using two identifiers.   I discussed the limitations, risks, security and privacy concerns of performing an evaluation and management service by telephone and the availability of in person appointments. I also discussed with the patient that there may be a patient responsible charge related to this service. The patient expressed understanding and agreed to proceed.   History of Present Illness: He is a 34 year old man who presented to the emergency department on July 13, 2019.  He was found to have acute appendicitis and underwent an uncomplicated laparoscopic appendectomy on March 20.  His postoperative course was uncomplicated and he was discharged postoperative day 1.   Observations/Objective: He reports that he has been healing well.  He says that the incisions are without any redness, tenderness, swelling, or drainage.  He says that the one near his umbilicus is a little bit sensitive and gets irritated with more vigorous movement, but it is not particularly bothersome.  He denies any fevers or chills.  No nausea or vomiting.  He is tolerating regular diet and having regular bowel movements.  Assessment and Plan: Tyler Jacobs is recovering well from his laparoscopic appendectomy for acute appendicitis.  He may resume all of his usual activities without restriction.  He will contact me if he feels he has any questions or concerns that he would like to have addressed.  Follow Up Instructions:    I discussed the assessment and treatment plan with the patient. The patient was provided an opportunity to ask questions and all were answered. The patient agreed with the plan and demonstrated an understanding of the instructions.   The patient was advised to call back or seek an in-person evaluation if the symptoms worsen or if the  condition fails to improve as anticipated.  I provided 5 minutes of non-face-to-face time during this encounter.   Duanne Guess, MD

## 2019-10-04 ENCOUNTER — Other Ambulatory Visit: Payer: Self-pay

## 2019-10-04 ENCOUNTER — Encounter: Payer: Self-pay | Admitting: Emergency Medicine

## 2019-10-04 DIAGNOSIS — Z8616 Personal history of COVID-19: Secondary | ICD-10-CM | POA: Insufficient documentation

## 2019-10-04 DIAGNOSIS — K529 Noninfective gastroenteritis and colitis, unspecified: Secondary | ICD-10-CM | POA: Diagnosis not present

## 2019-10-04 DIAGNOSIS — R1030 Lower abdominal pain, unspecified: Secondary | ICD-10-CM | POA: Diagnosis present

## 2019-10-04 LAB — URINALYSIS, COMPLETE (UACMP) WITH MICROSCOPIC
Bacteria, UA: NONE SEEN
Bilirubin Urine: NEGATIVE
Glucose, UA: NEGATIVE mg/dL
Hgb urine dipstick: NEGATIVE
Ketones, ur: NEGATIVE mg/dL
Leukocytes,Ua: NEGATIVE
Nitrite: NEGATIVE
Protein, ur: NEGATIVE mg/dL
Specific Gravity, Urine: 1.026 (ref 1.005–1.030)
pH: 7 (ref 5.0–8.0)

## 2019-10-04 LAB — CBC WITH DIFFERENTIAL/PLATELET
Abs Immature Granulocytes: 0.04 10*3/uL (ref 0.00–0.07)
Basophils Absolute: 0 10*3/uL (ref 0.0–0.1)
Basophils Relative: 0 %
Eosinophils Absolute: 0.2 10*3/uL (ref 0.0–0.5)
Eosinophils Relative: 2 %
HCT: 42.9 % (ref 39.0–52.0)
Hemoglobin: 15 g/dL (ref 13.0–17.0)
Immature Granulocytes: 1 %
Lymphocytes Relative: 15 %
Lymphs Abs: 1.3 10*3/uL (ref 0.7–4.0)
MCH: 28 pg (ref 26.0–34.0)
MCHC: 35 g/dL (ref 30.0–36.0)
MCV: 80 fL (ref 80.0–100.0)
Monocytes Absolute: 1 10*3/uL (ref 0.1–1.0)
Monocytes Relative: 11 %
Neutro Abs: 6.2 10*3/uL (ref 1.7–7.7)
Neutrophils Relative %: 71 %
Platelets: 173 10*3/uL (ref 150–400)
RBC: 5.36 MIL/uL (ref 4.22–5.81)
RDW: 13.9 % (ref 11.5–15.5)
WBC: 8.8 10*3/uL (ref 4.0–10.5)
nRBC: 0 % (ref 0.0–0.2)

## 2019-10-04 LAB — LIPASE, BLOOD: Lipase: 28 U/L (ref 11–51)

## 2019-10-04 LAB — COMPREHENSIVE METABOLIC PANEL
ALT: 30 U/L (ref 0–44)
AST: 27 U/L (ref 15–41)
Albumin: 4.2 g/dL (ref 3.5–5.0)
Alkaline Phosphatase: 53 U/L (ref 38–126)
Anion gap: 8 (ref 5–15)
BUN: 10 mg/dL (ref 6–20)
CO2: 27 mmol/L (ref 22–32)
Calcium: 8.7 mg/dL — ABNORMAL LOW (ref 8.9–10.3)
Chloride: 101 mmol/L (ref 98–111)
Creatinine, Ser: 1.18 mg/dL (ref 0.61–1.24)
GFR calc Af Amer: >60 mL/min (ref >60)
GFR calc non Af Amer: >60 mL/min (ref >60)
Glucose, Bld: 111 mg/dL — ABNORMAL HIGH (ref 70–99)
Potassium: 3.8 mmol/L (ref 3.5–5.1)
Sodium: 136 mmol/L (ref 135–145)
Total Bilirubin: 1.1 mg/dL (ref 0.3–1.2)
Total Protein: 7.8 g/dL (ref 6.5–8.1)

## 2019-10-04 MED ORDER — ACETAMINOPHEN 500 MG PO TABS
1000.0000 mg | ORAL_TABLET | Freq: Once | ORAL | Status: AC
  Administered 2019-10-04: 1000 mg via ORAL
  Filled 2019-10-04: qty 2

## 2019-10-04 NOTE — ED Triage Notes (Signed)
Patient ambulatory to triage with steady gait, without difficulty or distress noted, mask in place; pt reports since Tuesday having lower abd/back pain accomp by chills & urinary urgency

## 2019-10-05 ENCOUNTER — Emergency Department
Admission: EM | Admit: 2019-10-05 | Discharge: 2019-10-05 | Disposition: A | Discharge status: Home / Self Care | Payer: BC Managed Care – PPO | Attending: Emergency Medicine | Admitting: Emergency Medicine

## 2019-10-05 ENCOUNTER — Emergency Department: Payer: BC Managed Care – PPO

## 2019-10-05 DIAGNOSIS — K529 Noninfective gastroenteritis and colitis, unspecified: Secondary | ICD-10-CM

## 2019-10-05 IMAGING — CT CT ABD-PELV W/ CM
2 of 4 series · 16 of 46 positions shown, 18 images · IV contrast (APPLIED)
Comparison: CT abdomen pelvis [DATE]

CLINICAL DATA: Abdominal pain

EXAM:
CT ABDOMEN AND PELVIS WITH CONTRAST
TECHNIQUE: Multidetector CT imaging of the abdomen and pelvis was performed
using the standard protocol following bolus administration of
intravenous contrast.
CONTRAST:  100mL OMNIPAQUE IOHEXOL 300 MG/ML  SOLN

[Series 2: routine abd/pel with · axial · 0.84mm/px · z∈[-942,-536]mm · 13 of 182 slices shown, 15 images]
[im 14/182  soft-tissue]
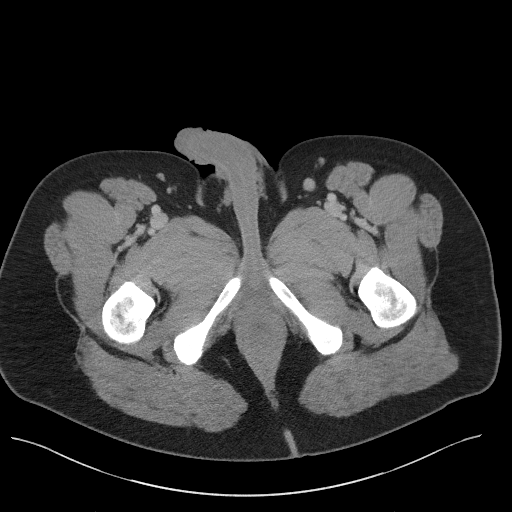
[im 14/182  bone]
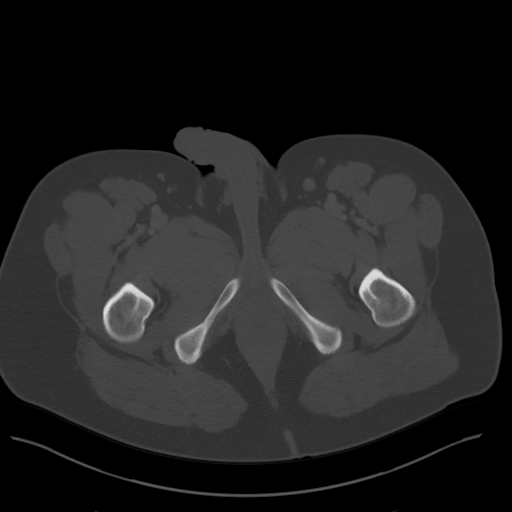
[im 27/182  soft-tissue]
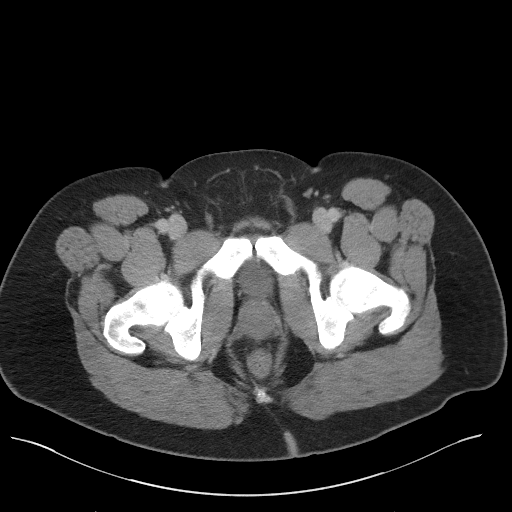
[im 41/182  soft-tissue]
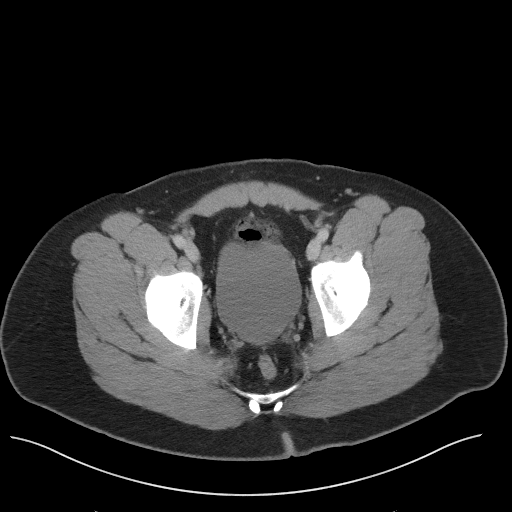
[im 54/182  soft-tissue]
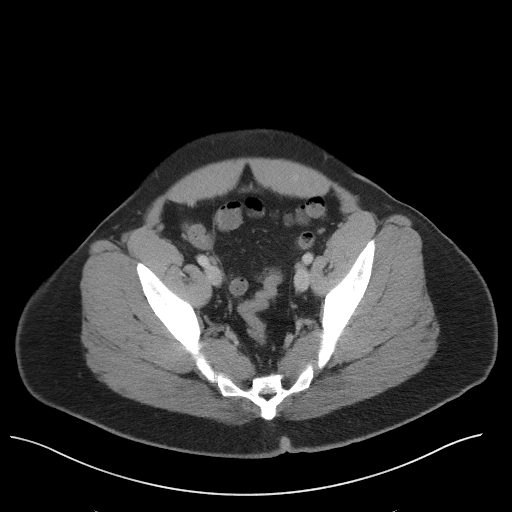
[im 68/182  soft-tissue]
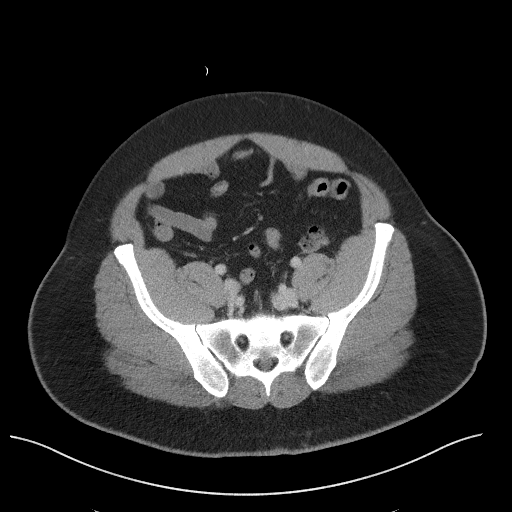
[im 81/182  soft-tissue]
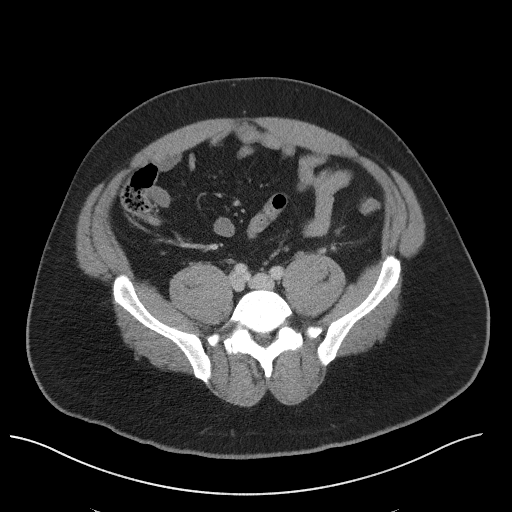
[im 94/182  soft-tissue]
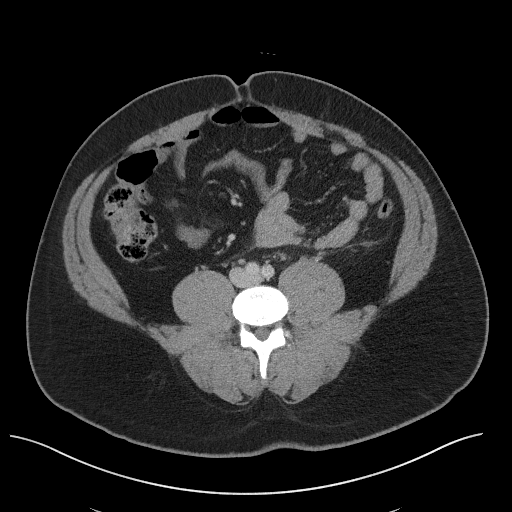
[im 108/182  soft-tissue]
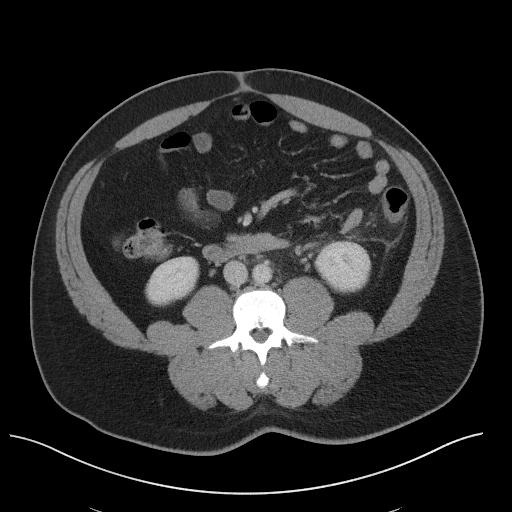
[im 121/182  soft-tissue]
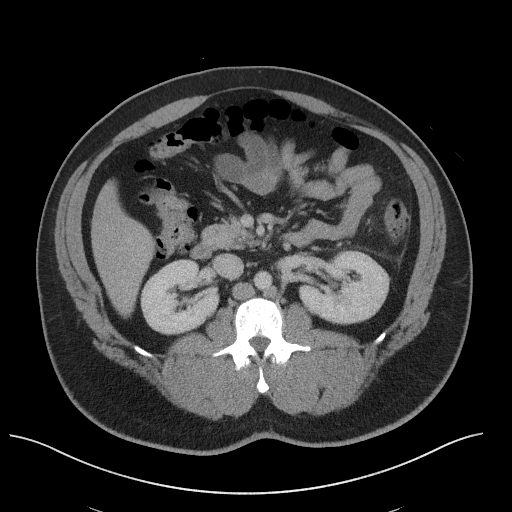
[im 121/182  bone]
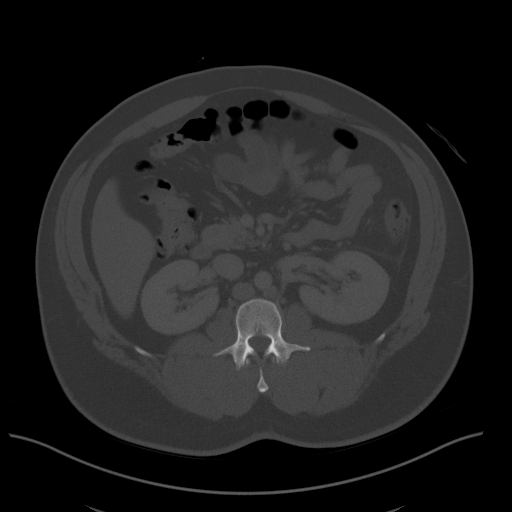
[im 135/182  soft-tissue]
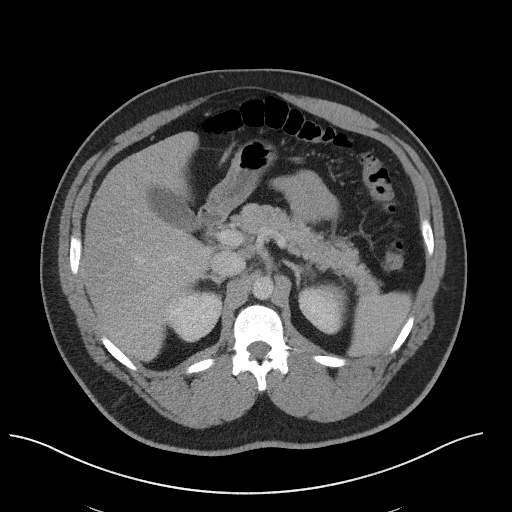
[im 148/182  soft-tissue]
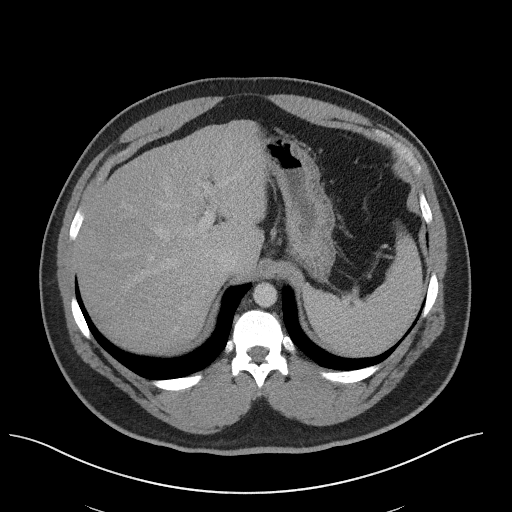
[im 161/182  soft-tissue]
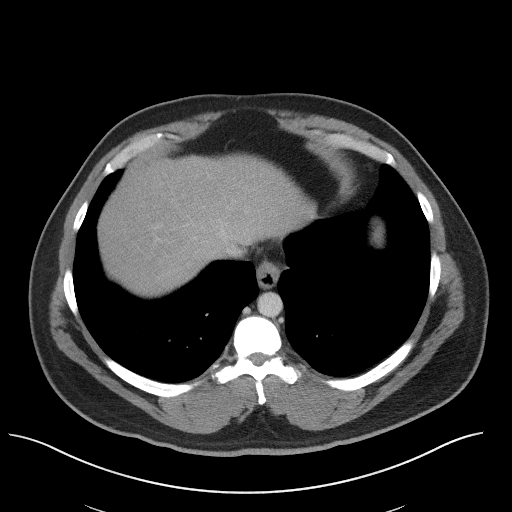
[im 175/182  soft-tissue]
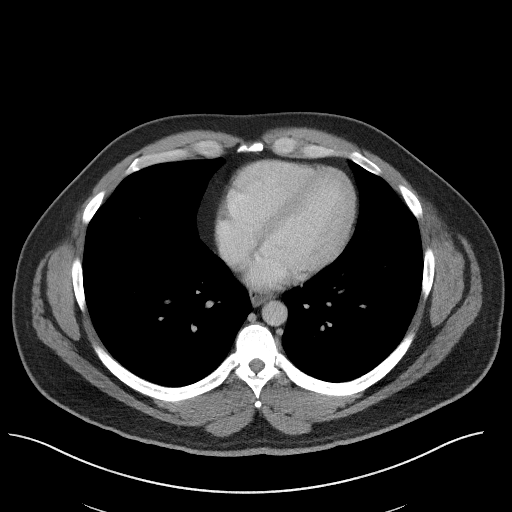

[Series 5: coronal st · coronal · 0.75mm/px · 3 of 99 slices shown]
[im 33/99  soft-tissue]
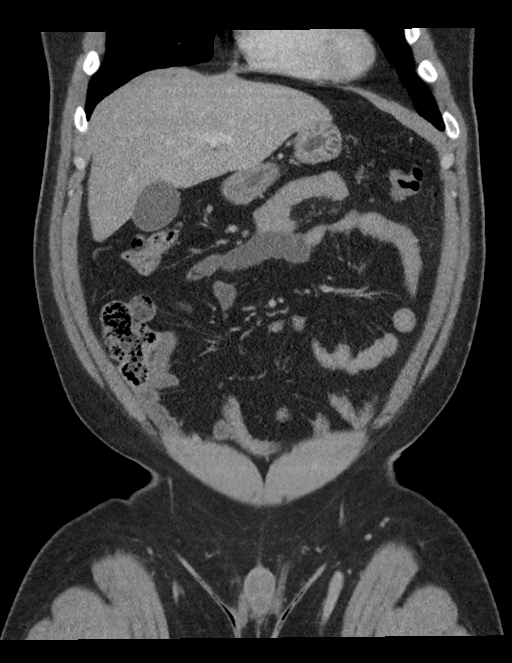
[im 44/99  soft-tissue]
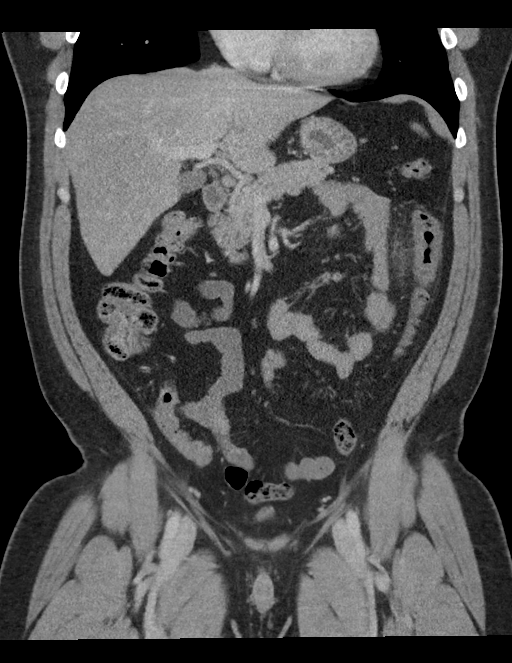
[im 55/99  soft-tissue]
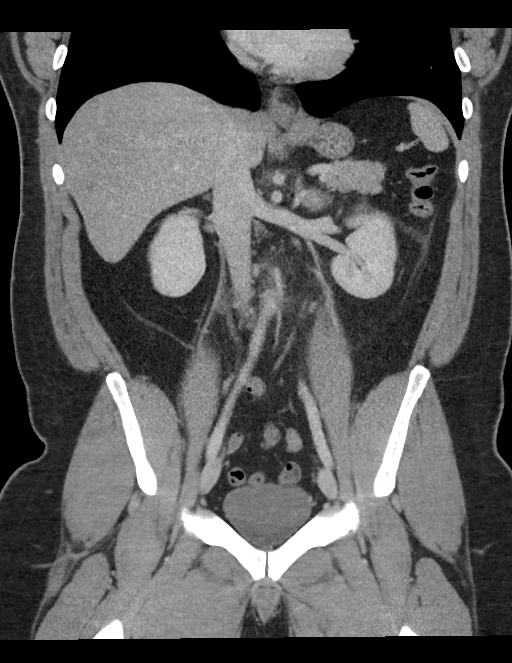

[16 of 46 positions shown; findings below may reference images not displayed]

FINDINGS: LOWER CHEST: Normal.

HEPATOBILIARY: Scattered hypodense foci, too small to characterize
accurately, but most likely hepatic cysts. Otherwise normal liver.
No biliary dilatation. The gallbladder is normal.

PANCREAS: Normal pancreas. No ductal dilatation or peripancreatic
fluid collection.

SPLEEN: Normal.

ADRENALS/URINARY TRACT: The adrenal glands are normal. No
hydronephrosis, nephroureterolithiasis or solid renal mass. The
urinary bladder is normal for degree of distention

STOMACH/BOWEL: There is no hiatal hernia. Normal duodenal course and
caliber. No small bowel dilatation or inflammation. There is
inflammatory stranding surrounding the proximal descending colon. No
fluid collection or free intraperitoneal air. Status post
appendectomy.

VASCULAR/LYMPHATIC: Normal course and caliber of the major abdominal
vessels. No abdominal or pelvic lymphadenopathy.

REPRODUCTIVE: Normal prostate size with symmetric seminal vesicles.

MUSCULOSKELETAL. No bony spinal canal stenosis or focal osseous
abnormality.

OTHER: None.
IMPRESSION: Acute colitis of the proximal descending colon without abscess or
free intraperitoneal air.

## 2019-10-05 MED ORDER — CIPROFLOXACIN HCL 500 MG PO TABS
500.0000 mg | ORAL_TABLET | Freq: Two times a day (BID) | ORAL | 0 refills | Status: AC
Start: 2019-10-05 — End: 2019-10-15

## 2019-10-05 MED ORDER — IOHEXOL 300 MG/ML  SOLN
100.0000 mL | Freq: Once | INTRAMUSCULAR | Status: AC | PRN
  Administered 2019-10-05: 100 mL via INTRAVENOUS

## 2019-10-05 MED ORDER — METRONIDAZOLE 500 MG PO TABS
500.0000 mg | ORAL_TABLET | Freq: Three times a day (TID) | ORAL | 0 refills | Status: AC
Start: 2019-10-05 — End: 2019-10-15

## 2019-10-05 MED ORDER — SODIUM CHLORIDE 0.9 % IV BOLUS
1000.0000 mL | Freq: Once | INTRAVENOUS | Status: AC
  Administered 2019-10-05: 1000 mL via INTRAVENOUS

## 2019-10-05 NOTE — ED Notes (Signed)
See triage note. Pt in with similar pain to his recent dx of appendicitis. Reports had surgery in March. Tender in abdomen. State BMs and urination have been baseline except that sometimes has urge to urinate a lot but only urinates very little. States other times urinates plenty/baseline. C/o chills/sweats last night. Pt did not check self for fever at home.

## 2019-10-05 NOTE — ED Provider Notes (Signed)
[facility] Emergency Department Provider Note  ____________________________________________   First MD Initiated Contact with Patient 10/05/19 [phone]     (approximate)  I have reviewed the triage vital signs and the nursing notes.   HISTORY  Chief Complaint Abdominal Pain    HPI Tyler Jacobs is a 34 y.o. adult with asthma who comes in with lower abdominal and back pain.  Patient states for the past 2 or 3 days has had lower abdominal pain that is moderate, constant, better with the Tylenol, nothing makes it worse.  States that he had a little bit of urinary urgency as well.  He reports having Covid 1 month ago.  He denies any worsening shortness of breath.  Does have a history of asthma.  Never needed to be hospitalized for the Covid.  States he still has a little bit of a residual cough but does not seem to be much worse than his normal.  No recent travel. No IV drugs. Not on any other medications. Otherwise healthy. Appendix removed back in march.           Past Medical History:  Diagnosis Date  . Asthma     Patient Active Problem List   Diagnosis Date Noted  . Acute appendicitis 07/13/2019    Past Surgical History:  Procedure Laterality Date  . LAPAROSCOPIC APPENDECTOMY N/A 07/14/2019   Procedure: APPENDECTOMY LAPAROSCOPIC;  Surgeon: Duanne Guess, MD;  Location: [facility] ORS;  Service: General;  Laterality: N/A;    Prior to Admission medications   Medication Sig Start Date End Date Taking? Authorizing Provider  acetaminophen (TYLENOL) 500 MG tablet Take 2 tablets (1,000 mg total) by mouth every 6 (six) hours. 07/15/19   Duanne Guess, MD  albuterol (PROVENTIL HFA;VENTOLIN HFA) 108 (90 BASE) MCG/ACT inhaler Inhale 1-2 puffs into the lungs every 6 (six) hours as needed for wheezing or shortness of breath.    [provider]  ibuprofen (ADVIL) 600 MG tablet Take 1 tablet (600 mg total) by mouth every 4 (four) hours as needed for fever,  headache, moderate pain or cramping. 07/15/19   Duanne Guess, MD  mupirocin ointment (BACTROBAN) 2 % Place 1 application into the nose 2 (two) times daily. 07/15/19   Duanne Guess, MD  oxyCODONE (OXY IR/ROXICODONE) 5 MG immediate release tablet Take 1 tablet (5 mg total) by mouth every 4 (four) hours as needed for severe pain or breakthrough pain. 07/15/19   Duanne Guess, MD    Allergies Patient has no known allergies.  Family History  Problem Relation Age of Onset  . Hypertension Other   . Diabetes Other   . Cancer Mother   . Hypertension Mother     Social History Social History   Tobacco Use  . Smoking status: Never Smoker  . Smokeless tobacco: Never Used  Vaping Use  . Vaping Use: Never used  Substance Use Topics  . Alcohol use: Not Currently    Comment: used to drink socially  . Drug use: No      Review of Systems Constitutional: Positive fever, chills Eyes: No visual changes. ENT: No sore throat. Cardiovascular: Denies chest pain. Respiratory: Denies shortness of breath. Gastrointestinal: Positive abdominal pain no nausea, no vomiting.  No diarrhea.  No constipation. Genitourinary: Negative for dysuria. Musculoskeletal: Pain wrapping around the back Skin: Negative for rash. Neurological: Negative for headaches, focal weakness or numbness. All other ROS negative ____________________________________________   PHYSICAL EXAM:  VITAL SIGNS: ED Triage Vitals  Enc Vitals Group  BP 10/04/19 2024 119/71     Pulse Rate 10/04/19 2024 (!) 113     Resp 10/04/19 2024 16     Temp 10/04/19 2024 (!) 101.1 F (38.4 C)     Temp Source 10/04/19 2024 Oral     SpO2 10/04/19 2024 97 %     Weight 10/04/19 2026 225 lb (102.1 kg)     Height 10/04/19 2026 5' 6.5" (1.689 m)     Head Circumference --      Peak Flow --      Pain Score 10/04/19 2054 6     Pain Loc --      Pain Edu? --      Excl. in GC? --     Constitutional: Alert and oriented. Well appearing  and in no acute distress. Eyes: Conjunctivae are normal. EOMI. Head: Atraumatic. Nose: No congestion/rhinnorhea. Mouth/Throat: Mucous membranes are moist.   Neck: No stridor. Trachea Midline. FROM Cardiovascular: Tachycardic, regular rhythm. Grossly normal heart sounds.  Good peripheral circulation. Respiratory: Normal respiratory effort.  No retractions. Lungs CTAB. Gastrointestinal: Tender in the lower abdomen.  No distention. No abdominal bruits.  Musculoskeletal: No lower extremity tenderness nor edema.  No joint effusions. Neurologic:  Normal speech and language. No gross focal neurologic deficits are appreciated.  Skin:  Skin is warm, dry and intact. No rash noted. Psychiatric: Mood and affect are normal. Speech and behavior are normal. GU: Deferred  Back: No CTL spine tenderness  ____________________________________________   LABS (all labs ordered are listed, but only abnormal results are displayed)  Labs Reviewed  COMPREHENSIVE METABOLIC PANEL - Abnormal; Notable for the following components:      Result Value   Glucose, Bld 111 (*)    Calcium 8.7 (*)    All other components within normal limits  URINALYSIS, COMPLETE (UACMP) WITH MICROSCOPIC - Abnormal; Notable for the following components:   Color, Urine YELLOW (*)    APPearance HAZY (*)    All other components within normal limits  CBC WITH DIFFERENTIAL/PLATELET  LIPASE, BLOOD   ____________________________________________   RADIOLOGY Vela Prose, personally viewed and evaluated these images (plain radiographs) as part of my medical decision making, as well as reviewing the written report by the radiologist.  ED MD interpretation: No pneumonia  Official radiology report(s): CT ABDOMEN PELVIS W CONTRAST  Result Date: 10/05/2019 CLINICAL DATA:  Abdominal pain EXAM: CT ABDOMEN AND PELVIS WITH CONTRAST TECHNIQUE: Multidetector CT imaging of the abdomen and pelvis was performed using the standard protocol  following bolus administration of intravenous contrast. CONTRAST:  OMNIPAQUE IOHEXOL 300 MG/ML  SOLN COMPARISON:  CT abdomen pelvis 07/13/2019 FINDINGS: LOWER CHEST: Normal. HEPATOBILIARY: Scattered hypodense foci, too small to characterize accurately, but most likely hepatic cysts. Otherwise normal liver. No biliary dilatation. The gallbladder is normal. PANCREAS: Normal pancreas. No ductal dilatation or peripancreatic fluid collection. SPLEEN: Normal. ADRENALS/URINARY TRACT: The adrenal glands are normal. No hydronephrosis, nephroureterolithiasis or solid renal mass. The urinary bladder is normal for degree of distention STOMACH/BOWEL: There is no hiatal hernia. Normal duodenal course and caliber. No small bowel dilatation or inflammation. There is inflammatory stranding surrounding the proximal descending colon. No fluid collection or free intraperitoneal air. Status post appendectomy. VASCULAR/LYMPHATIC: Normal course and caliber of the major abdominal vessels. No abdominal or pelvic lymphadenopathy. REPRODUCTIVE: Normal prostate size with symmetric seminal vesicles. MUSCULOSKELETAL. No bony spinal canal stenosis or focal osseous abnormality. OTHER: None. IMPRESSION: Acute colitis of the proximal descending colon without abscess or free intraperitoneal  air. Electronically Signed   By: Ulyses Jarred M.D.   On: 10/05/2019 03:16   DG Chest Portable 1 View  Result Date: 10/05/2019 CLINICAL DATA:  Cough EXAM: PORTABLE CHEST 1 VIEW COMPARISON:  02/21/2009 FINDINGS: The heart size and mediastinal contours are within normal limits. Both lungs are clear. The visualized skeletal structures are unremarkable. IMPRESSION: No active disease. Electronically Signed   By: Ulyses Jarred M.D.   On: 10/05/2019 01:25    ____________________________________________   PROCEDURES  Procedure(s) performed (including Critical Care):  Procedures   ____________________________________________   INITIAL IMPRESSION /  ASSESSMENT AND PLAN / ED COURSE  Tyler Jacobs was evaluated in Emergency Department on 10/05/2019 for the symptoms described in the history of present illness. He was evaluated in the context of the global COVID-19 pandemic, which necessitated consideration that the patient might be at risk for infection with the SARS-CoV-2 virus that causes COVID-19. Institutional protocols and algorithms that pertain to the evaluation of patients at risk for COVID-19 are in a state of rapid change based on information released by regulatory bodies including the CDC and federal and state organizations. These policies and algorithms were followed during the patient's care in the ED.    Patient is a 34 year old gentleman who comes in febrile and tachycardiac with lower abdominal pain, occasionally into back, urinary urgency..  Will get urine to evaluate for UTI.  Will need CT scan.  Patient has had appendicitis recently will need to get CT evaluate for postop complication such as abscess versus infected kidney stone versus diverticulitis.  Patient already had Covid 1 month ago so unlikely to be the cause but will get chest x-ray to make sure no developing pneumonia post Covid.  Denies any headache or neck stiffness to suggest meningitis.  No sore throat to suggest otitis media.  Denies being an IV drug user and denies any midline back tenderness to suggest epidural abscess.    UA without evidence of UTI  Labs are reassuring  CT scan:  Acute colitis of the proximal descending colon without abscess or free intraperitoneal air.  Chest xray negative for PNA  Patient temperature has come down with Tylenol.  Patient's pain is well controlled with the Tylenol only.  Patient is to continue to be able to tolerate p.o.  CT scan concerning for colitis.  Discussed with patient and he denies any recent antibiotic use.  He denies any diarrhea to suggest C. difficile.  Denies any recent travel.  Given the fevers I suspect this is  most likely infectious in nature and will start on Cipro and Flagyl.  Will give GI follow-up.  Discussed with patient and given he is so well-appearing I have low suspicion for bacteremia or sepsis and he feels comfortable with going home on antibiotics.  He understands that if his symptoms are worsening that he can return to the ER immediately for repeat evaluation.  I discussed the provisional nature of ED diagnosis, the treatment so far, the ongoing plan of care, follow up appointments and return precautions with the patient and any family or support people present. They expressed understanding and agreed with the plan, discharged home.           ____________________________________________   FINAL CLINICAL IMPRESSION(S) / ED DIAGNOSES   Final diagnoses:  Colitis      MEDICATIONS GIVEN DURING THIS VISIT:  Medications  acetaminophen (TYLENOL) tablet 1,000 mg (1,000 mg Oral Given 10/04/19 2058)  sodium chloride 0.9 % bolus 1,000 mL (  Intravenous Stopped 10/05/19 0240)  iohexol (OMNIPAQUE) 300 MG/ML solution 100 mL (100 mLs Intravenous Contrast Given 10/05/19 0973)     ED Discharge Orders         Ordered    ciprofloxacin (CIPRO) 500 MG tablet  2 times daily     Discontinue  Reprint     10/05/19 0410    metroNIDAZOLE (FLAGYL) 500 MG tablet  3 times daily     Discontinue  Reprint     10/05/19 0410           Note:  This document was prepared using Dragon voice recognition software and may include unintentional dictation errors.   Concha Se, MD 10/05/19 [phone]

## 2019-10-05 NOTE — ED Notes (Signed)
MD at bedside. 

## 2019-10-05 NOTE — Discharge Instructions (Signed)
Your CT is as below. Given fevers I am concerned for infectious colitis. Take tylenol 1g every 8 hours and ibuprofen 400-600 every 8 hours with food to help with pain. Take antibiotics as well. Do not drink while on this.  You can follow-up with your primary care doctor 2 days and return the ER for symptoms are worsening.  Otherwise you follow-up with GI when they can schedule you an appointment if you continue to have bouts of colitis   CT scan:  Acute colitis of the proximal descending colon without abscess or free intraperitoneal air.

## 2019-10-10 ENCOUNTER — Other Ambulatory Visit
Admission: RE | Admit: 2019-10-10 | Discharge: 2019-10-10 | Disposition: A | Discharge status: Home / Self Care | Payer: BC Managed Care – PPO | Source: Ambulatory Visit | Attending: Nurse Practitioner | Admitting: Nurse Practitioner

## 2019-10-10 ENCOUNTER — Other Ambulatory Visit: Payer: Self-pay | Admitting: Nurse Practitioner

## 2019-10-10 ENCOUNTER — Encounter: Payer: Self-pay | Admitting: Nurse Practitioner

## 2019-10-10 ENCOUNTER — Other Ambulatory Visit: Payer: Self-pay

## 2019-10-10 ENCOUNTER — Ambulatory Visit (INDEPENDENT_AMBULATORY_CARE_PROVIDER_SITE_OTHER): Payer: BC Managed Care – PPO | Admitting: Nurse Practitioner

## 2019-10-10 VITALS — BP 118/72 | HR 106 | Temp 97.8°F | Ht 65.75 in | Wt 222.8 lb

## 2019-10-10 DIAGNOSIS — K529 Noninfective gastroenteritis and colitis, unspecified: Secondary | ICD-10-CM

## 2019-10-10 LAB — CBC WITH DIFFERENTIAL/PLATELET
Basophils Absolute: 0 10*3/uL (ref 0.0–0.1)
Basophils Relative: 0.3 % (ref 0.0–3.0)
Eosinophils Absolute: 0.2 10*3/uL (ref 0.0–0.7)
Eosinophils Relative: 2.1 % (ref 0.0–5.0)
HCT: 41.2 % (ref 39.0–52.0)
Hemoglobin: 14.1 g/dL (ref 13.0–17.0)
Lymphocytes Relative: 12.1 % (ref 12.0–46.0)
Lymphs Abs: 1.4 10*3/uL (ref 0.7–4.0)
MCHC: 34.1 g/dL (ref 30.0–36.0)
MCV: 81.7 fl (ref 78.0–100.0)
Monocytes Absolute: 1.5 10*3/uL — ABNORMAL HIGH (ref 0.1–1.0)
Monocytes Relative: 13.1 % — ABNORMAL HIGH (ref 3.0–12.0)
Neutro Abs: 8.5 10*3/uL — ABNORMAL HIGH (ref 1.4–7.7)
Neutrophils Relative %: 72.4 % (ref 43.0–77.0)
Platelets: 277 10*3/uL (ref 150.0–400.0)
RBC: 5.04 Mil/uL (ref 4.22–5.81)
RDW: 15.3 % (ref 11.5–15.5)
WBC: 11.7 10*3/uL — ABNORMAL HIGH (ref 4.0–10.5)

## 2019-10-10 LAB — COMPREHENSIVE METABOLIC PANEL
ALT: 45 U/L (ref 0–53)
AST: 32 U/L (ref 0–37)
Albumin: 3.6 g/dL (ref 3.5–5.2)
Alkaline Phosphatase: 63 U/L (ref 39–117)
BUN: 14 mg/dL (ref 6–23)
CO2: 27 mEq/L (ref 19–32)
Calcium: 8.5 mg/dL (ref 8.4–10.5)
Chloride: 100 mEq/L (ref 96–112)
Creatinine, Ser: 1.07 mg/dL (ref 0.40–1.50)
GFR: 95.14 mL/min (ref >60.00)
Glucose, Bld: 95 mg/dL (ref 70–99)
Potassium: 3.3 mEq/L — ABNORMAL LOW (ref 3.5–5.1)
Sodium: 138 mEq/L (ref 135–145)
Total Bilirubin: 0.6 mg/dL (ref 0.2–1.2)
Total Protein: 6.8 g/dL (ref 6.0–8.3)

## 2019-10-10 LAB — C DIFFICILE QUICK SCREEN W PCR REFLEX
C Diff antigen: NEGATIVE
C Diff interpretation: NOT DETECTED
C Diff toxin: NEGATIVE

## 2019-10-10 MED ORDER — ONDANSETRON 4 MG PO TBDP
4.0000 mg | ORAL_TABLET | Freq: Three times a day (TID) | ORAL | 0 refills | Status: AC | PRN
Start: 2019-10-10 — End: ?

## 2019-10-10 MED ORDER — AMOXICILLIN-POT CLAVULANATE 875-125 MG PO TABS
1.0000 | ORAL_TABLET | Freq: Two times a day (BID) | ORAL | 0 refills | Status: AC
Start: 2019-10-10 — End: 2019-10-17

## 2019-10-10 MED ORDER — POTASSIUM CHLORIDE ER 10 MEQ PO TBCR: 10.0000 meq | EXTENDED_RELEASE_TABLET | Freq: Every day | ORAL | 0 refills | Status: AC

## 2019-10-10 NOTE — Progress Notes (Signed)
I ordered potassium 10 mEq daily x 5 days.

## 2019-10-10 NOTE — Progress Notes (Addendum)
New Patient Office Visit  Subjective:  Patient ID: Tyler Jacobs, adult    DOB: May 09, 1985  Age: 34 y.o. MRN: [id number]  CC:  Chief Complaint  Patient presents with  . New Patient (Initial Visit)    establish care  . HPI Tyler Jacobs is a 34 yo with history of exercise-induced asthma, laparoscopic appendectomy for acute appendicitis in March, Covid infection in April treated as an outpatient, and a recent  ER visit for abdominal pain with fever and CT documented colitis treated with Cipro/Flagyl.  He was referred to GI following ED visit.  He has not yet made an appointment with gastroenterology.  He came in today as an acute care patient wanting to discuss the Cipro/Flagyl medications because they have been making him sick and he had to stop them prematurely.   He went to the ED on 10/05/2019 for left lower quadrant abdominal pain with lower back pain and fever 101.1. He had a normal CBC, UA, and the CT abd/pelvis W contrast  showed inflammatory stranding surrounding the proximal descending colon.  No fluid collections or free intraperitoneal air.  Status post appendectomy.  He was started on Cipro and Flagyl as it was thought that this was from an infectious origin given the fever.  He took the Cipro and Flagyl on Friday and within 30 to 40 minutes he vomited.  The next day, he tried taking them again this time with food and retained them about 30 minutes before vomiting again.  His fever broke on Saturday.  On Sunday, he cut back to one dose a day.  He developed diarrhea on Saturday and Sunday.  He did not have any bowel movements on Thursday or Friday when the abdominal pain started. He stopped the Cipro/Flagyl  altogether on Monday.  Today, his bowel movements are starting to firm up, 2-3 soft stools today.  He has had no blood or melena.  He reports he is 75% to 80%  better today than when he was in the ED. He still has bilateral lower abdominal ache when he twists moves or walks.   Sitting still or laying down he has no pain.   He is staying hydrated and eating lightly. No nausea or vomiting off of the antibiotics. His urine is getting to be a normal color.   Tested positive Covid- in April 2021- fatigue, loss of smell, body aches, cough and URI sx over a 10 day period-  Asthma did not worsen. No immunoglobulin therapy. No vaccines.   Past Medical History:  Diagnosis Date  . Asthma   . COVID-19 virus infection 07/2019    Past Surgical History:  Procedure Laterality Date  . LAPAROSCOPIC APPENDECTOMY N/A 07/14/2019   Procedure: APPENDECTOMY LAPAROSCOPIC;  Surgeon: Tyler Maudlin, MD;  Location: [facility] ORS;  Service: General;  Laterality: N/A;    Family History  Problem Relation Age of Onset  . Hypertension Other   . Diabetes Other   . Cancer Mother   . Hypertension Mother     Social History   Socioeconomic History  . Marital status: Married    Spouse name: Tyler Jacobs  . Number of children: 2  . Years of education: Not on file  . Highest education level: Not on file  Occupational History  . Not on file  Tobacco Use  . Smoking status: Never Smoker  . Smokeless tobacco: Never Used  Vaping Use  . Vaping Use: Never used  Substance and Sexual Activity  . Alcohol use:  Not Currently    Comment: used to drink socially  . Drug use: No  . Sexual activity: Yes    Birth control/protection: None    Comment: married  Other Topics Concern  . Not on file  Social History Narrative  . Not on file   Social Determinants of Health   Financial Resource Strain:   . Difficulty of Paying Living Expenses:   Food Insecurity:   . Worried About Charity fundraiser in the Last Year:   . Arboriculturist in the Last Year:   Transportation Needs:   . Film/video editor (Medical):   Marland Kitchen Lack of Transportation (Non-Medical):   Physical Activity:   . Days of Exercise per Week:   . Minutes of Exercise per Session:   Stress:   . Feeling of Stress :   Social  Connections:   . Frequency of Communication with Friends and Family:   . Frequency of Social Gatherings with Friends and Family:   . Attends Religious Services:   . Active Member of Clubs or Organizations:   . Attends Archivist Meetings:   Marland Kitchen Marital Status:   Intimate Partner Violence:   . Fear of Current or Ex-Partner:   . Emotionally Abused:   Marland Kitchen Physically Abused:   . Sexually Abused:     ROS Review of Systems  Constitutional: Positive for appetite change. Negative for chills and fever.  HENT: Negative for congestion, sinus pressure and sore throat.   Eyes: Negative.   Respiratory: Positive for cough.        Lingering cough from Covid in April - non productive. No wheezing or asthma flare.   Cardiovascular: Negative for chest pain, palpitations and leg swelling.  Gastrointestinal: Positive for abdominal pain. Negative for blood in stool, constipation, diarrhea, nausea and vomiting.       Pain started on the left last week and now has lower abd pain - when he moves- he feels it bilateral - like an ache and it is quick- resolves if he doesn't. He is taking ibuprofen better.   No pain RLQ at appendectomy site. Off pain medicine.   Endocrine: Negative.   Genitourinary: Negative for difficulty urinating.  Musculoskeletal: Negative for back pain.  Skin: Negative for rash.  Allergic/Immunologic: Positive for environmental allergies.       OTC Zyrtec  Neurological: Negative for dizziness and light-headedness.  Psychiatric/Behavioral:        No concerns about depression/anxiety    Objective:   Today's Vitals: BP 118/72 (BP Location: Left Arm, Patient Position: Sitting, Cuff Size: Normal)   Pulse (!) 106   Temp 97.8 F (36.6 C) (Skin)   Ht 5' 5.75" (1.67 m)   Wt 222 lb 12.8 oz (101.1 kg)   SpO2 98%   BMI 36.23 kg/m   Physical Exam Vitals reviewed.  Constitutional:      Appearance: Normal appearance. He is obese. He is not ill-appearing.  HENT:     Head:  Normocephalic.  Eyes:     Pupils: Pupils are equal, round, and reactive to light.  Cardiovascular:     Rate and Rhythm: Normal rate and regular rhythm.     Pulses: Normal pulses.     Heart sounds: Normal heart sounds.  Pulmonary:     Effort: Pulmonary effort is normal.     Breath sounds: Normal breath sounds.  Abdominal:     General: Bowel sounds are normal. There is no distension.     Palpations: Abdomen  is soft.     Tenderness: There is abdominal tenderness. There is rebound. There is no guarding.     Comments: He has definite tenderness in left abdomen compared to right. Abdomen is obese, soft and non distended. Positive rebound on the left.   Musculoskeletal:        General: Normal range of motion.     Cervical back: Normal range of motion and neck supple.  Skin:    General: Skin is warm and dry.  Neurological:     General: No focal deficit present.     Mental Status: He is alert and oriented to person, place, and time.  Psychiatric:        Mood and Affect: Mood normal.        Behavior: Behavior normal.        Thought Content: Thought content normal.        Judgment: Judgment normal.      Chemistry      Component Value Date/Time   NA 138 10/10/2019 1123   K 3.3 (L) 10/10/2019 1123   CL 100 10/10/2019 1123   CO2 27 10/10/2019 1123   BUN 14 10/10/2019 1123   CREATININE 1.07 10/10/2019 1123      Component Value Date/Time   CALCIUM 8.5 10/10/2019 1123   ALKPHOS 63 10/10/2019 1123   AST 32 10/10/2019 1123   ALT 45 10/10/2019 1123   BILITOT 0.6 10/10/2019 1123     Lab Results  Component Value Date   WBC 11.7 (H) 10/10/2019   HGB 14.1 10/10/2019   HCT 41.2 10/10/2019   MCV 81.7 10/10/2019   PLT 277.0 10/10/2019    CLINICAL DATA:  Abdominal pain  EXAM: CT ABDOMEN AND PELVIS WITH CONTRAST  TECHNIQUE: Multidetector CT imaging of the abdomen and pelvis was performed using the standard protocol following bolus administration of intravenous  contrast.  CONTRAST:  141m OMNIPAQUE IOHEXOL 300 MG/ML  SOLN  COMPARISON:  CT abdomen pelvis 07/13/2019  FINDINGS: LOWER CHEST: Normal.  HEPATOBILIARY: Scattered hypodense foci, too small to characterize accurately, but most likely hepatic cysts. Otherwise normal liver. No biliary dilatation. The gallbladder is normal.  PANCREAS: Normal pancreas. No ductal dilatation or peripancreatic fluid collection.  SPLEEN: Normal.  ADRENALS/URINARY TRACT: The adrenal glands are normal. No hydronephrosis, nephroureterolithiasis or solid renal mass. The urinary bladder is normal for degree of distention  STOMACH/BOWEL: There is no hiatal hernia. Normal duodenal course and caliber. No small bowel dilatation or inflammation. There is inflammatory stranding surrounding the proximal descending colon. No fluid collection or free intraperitoneal air. Status post appendectomy.  VASCULAR/LYMPHATIC: Normal course and caliber of the major abdominal vessels. No abdominal or pelvic lymphadenopathy.  REPRODUCTIVE: Normal prostate size with symmetric seminal vesicles.  MUSCULOSKELETAL. No bony spinal canal stenosis or focal osseous abnormality.  OTHER: None.  IMPRESSION: Acute colitis of the proximal descending colon without abscess or free intraperitoneal air.   Electronically Signed   By: KUlyses JarredM.D.   On: 10/05/2019 03:16  Assessment & Plan:   Problem List Items Addressed This Visit    None    Visit Diagnoses    Acute colitis    -  Primary   Relevant Orders   CBC with Differential/Platelet (Completed)   Comp Met (CMET) (Completed)   Ambulatory referral to Gastroenterology   Clostridium difficile culture-fecal      Outpatient Encounter Medications as of 10/10/2019  Medication Sig  . acetaminophen (TYLENOL) 500 MG tablet Take 2 tablets (1,000 mg total) by  mouth every 6 (six) hours.  Marland Kitchen albuterol (PROVENTIL HFA;VENTOLIN HFA) 108 (90 BASE) MCG/ACT inhaler  Inhale 1-2 puffs into the lungs every 6 (six) hours as needed for wheezing or shortness of breath.  . ciprofloxacin (CIPRO) 500 MG tablet Take 1 tablet (500 mg total) by mouth 2 (two) times daily for 10 days.  Marland Kitchen ibuprofen (ADVIL) 600 MG tablet Take 1 tablet (600 mg total) by mouth every 4 (four) hours as needed for fever, headache, moderate pain or cramping.  . metroNIDAZOLE (FLAGYL) 500 MG tablet Take 1 tablet (500 mg total) by mouth 3 (three) times daily for 10 days.  Marland Kitchen amoxicillin-clavulanate (AUGMENTIN) 875-125 MG tablet Take 1 tablet by mouth 2 (two) times daily for 7 days.  . ondansetron (ZOFRAN ODT) 4 MG disintegrating tablet Take 1 tablet (4 mg total) by mouth every 8 (eight) hours as needed for nausea or vomiting.  . [DISCONTINUED] mupirocin ointment (BACTROBAN) 2 % Place 1 application into the nose 2 (two) times daily. (Patient not taking: Reported on 10/10/2019)  . [DISCONTINUED] oxyCODONE (OXY IR/ROXICODONE) 5 MG immediate release tablet Take 1 tablet (5 mg total) by mouth every 4 (four) hours as needed for severe pain or breakthrough pain. (Patient not taking: Reported on 10/10/2019)   No facility-administered encounter medications on file as of 10/10/2019.   Please go to the lab today for blood work. Stool for C difficile returns NEG.  He is not able to tolerate the Cipro/Flagyl. He does report feeling better on the medication and his fever broke to 99 on Saturday. His stools are firming up. He is tolerating his diet and liquids.  He still has concerning tenderness on exam and mild leukocytosis on the CBC. I do not think the bacterial infection that caused the colitis is resolved. I ordered potassium 10 mEq daily x 5 days.  Augmentin to take twice daily with food to complete the antibiotic course for the bacterial colitis.  Zofran- nausea medication to take up to 3 times a day if needed.    Begin a probiotic Florastor 1 to 2 tablets daily to put good bacteria in the colon.  Push fluids  and hydrate.  Low fiber  diet with scrambled eggs, toast, potatoes, no nuts/seeds,  fresh garden fruits or vegetables.  Yogurt is a good probiotic.  I placed an urgent referral into [facility] they should be calling you.  If you do not tolerate the Augmentin, if you have vomiting, new or worsening pain then you need to go back to the emergency department.  Otherwise, follow-up with me in 1 week.  You will also follow-up with the GI department with a call for an appointment.  Follow-up: Return in about 1 week (around 10/17/2019).   This visit occurred during the SARS-CoV-2 public health emergency.  Safety protocols were in place, including screening questions prior to the visit, additional usage of staff PPE, and extensive cleaning of exam room while observing appropriate contact time as indicated for disinfecting solutions.   Denice Paradise, NP

## 2019-10-10 NOTE — Patient Instructions (Addendum)
.  Please go to the lab today for blood work  Please obtain stool study if stools are pourable.  Augmentin to take twice daily with food to complete the antibiotic course for the colitis.  Zofran- nausea medication to take up to 3 times a day if needed.    Begin a probiotic Florastor 1 to 2 tablets daily to put good bacteria in the colon.  Push fluids and hydrate.  Low fiber  diet with scrambled eggs, toast, potatoes, no nuts/seeds,  fresh garden fruits or vegetables.  Yogurt is a good probiotic.  I placed a referral into [facility] they should be calling you.  If you do not tolerate the Augmentin, if you have vomiting, new or worsening pain then you need to go back to the emergency department.  Otherwise, follow-up with me in 1 week.  You will also follow-up with the GI department with a call for an appointment. Colitis  Colitis is inflammation of the colon. Colitis may last a short time (be acute), or it may last a long time (become chronic). What are the causes? This condition may be caused by:  Viruses.  Bacteria.  Reaction to medicine.  Certain autoimmune diseases such as Crohn's disease or ulcerative colitis.  Radiation treatment.  Decreased blood flow to the bowel (ischemia). What are the signs or symptoms? Symptoms of this condition include:  Watery diarrhea.  Passing bloody or tarry stool.  Pain.  Fever.  Vomiting.  Tiredness (fatigue).  Weight loss.  Bloating.  Abdominal pain.  Having fewer bowel movements than usual.  A strong and sudden urge to have a bowel movement.  Feeling like the bowel is not empty after a bowel movement. How is this diagnosed? This condition is diagnosed with a stool test or a blood test. You may also have other tests, such as:  X-rays.  CT scan.  Colonoscopy.  Endoscopy.  Biopsy. How is this treated? Treatment for this condition depends on the cause. The condition may be treated by:  Resting the  bowel. This involves not eating or drinking for a period of time.  Fluids that are given through an IV.  Medicine for pain and diarrhea.  Antibiotic medicines.  Cortisone medicines.  Surgery. Follow these instructions at home: Eating and drinking   Follow instructions from your health care provider about eating or drinking restrictions.  Drink enough fluid to keep your urine pale yellow.  Work with a dietitian to determine which foods cause your condition to flare up.  Avoid foods that cause flare-ups.  Eat a well-balanced diet. General instructions  If you were prescribed an antibiotic medicine, take it as told by your health care provider. Do not stop taking the antibiotic even if you start to feel better.  Take over-the-counter and prescription medicines only as told by your health care provider.  Keep all follow-up visits as told by your health care provider. This is important. Contact a health care provider if:  Your symptoms do not go away.  You develop new symptoms. Get help right away if you:  Have a fever that does not go away with treatment.  Develop chills.  Have extreme weakness, fainting, or dehydration.  Have repeated vomiting.  Develop severe pain in your abdomen.  Pass bloody or tarry stool. Summary  Colitis is inflammation of the colon. Colitis may last a short time (be acute), or it may last a long time (become chronic).  Treatment for this condition depends on the cause and may  include resting the bowel, taking medicines, or having surgery.  If you were prescribed an antibiotic medicine, take it as told by your health care provider. Do not stop taking the antibiotic even if you start to feel better.  Get help right away if you develop severe pain in your abdomen.  Keep all follow-up visits as told by your health care provider. This is important. This information is not intended to replace advice given to you by your health care provider.  Make sure you discuss any questions you have with your health care provider. Document Revised: 10/13/2017 Document Reviewed: 10/13/2017 Elsevier Patient Education  2020 [employer] Inc[employer]  Diarrhea, Adult Diarrhea is frequent loose and watery bowel movements. Diarrhea can make you feel weak and cause you to become dehydrated. Dehydration can make you tired and thirsty, cause you to have a dry mouth, and decrease how often you urinate. Diarrhea typically lasts 2-3 days. However, it can last longer if it is a sign of something more serious. It is important to treat your diarrhea as told by your health care provider. Follow these instructions at home: Eating and drinking     Follow these recommendations as told by your health care provider:  Take an oral rehydration solution (ORS). This is an over-the-counter medicine that helps return your body to its normal balance of nutrients and water. It is found at pharmacies and retail stores.  Drink plenty of fluids, such as water, ice chips, diluted fruit juice, and low-calorie sports drinks. You can drink milk also, if desired.  Avoid drinking fluids that contain a lot of sugar or caffeine, such as energy drinks, sports drinks, and soda.  Eat bland, easy-to-digest foods in small amounts as you are able. These foods include bananas, applesauce, rice, lean meats, toast, and crackers.  Avoid alcohol.  Avoid spicy or fatty foods.  Medicines  Take over-the-counter and prescription medicines only as told by your health care provider.  If you were prescribed an antibiotic medicine, take it as told by your health care provider. Do not stop using the antibiotic even if you start to feel better. General instructions   Wash your hands often using soap and water. If soap and water are not available, use a hand sanitizer. Others in the household should wash their hands as well. Hands should be washed: ? After using the toilet or changing a diaper. ? Before  preparing, cooking, or serving food. ? While caring for a sick person or while visiting someone in a hospital.  Drink enough fluid to keep your urine pale yellow.  Rest at home while you recover.  Watch your condition for any changes.  Take a warm bath to relieve any burning or pain from frequent diarrhea episodes.  Keep all follow-up visits as told by your health care provider. This is important. Contact a health care provider if:  You have a fever.  Your diarrhea gets worse.  You have new symptoms.  You cannot keep fluids down.  You feel light-headed or dizzy.  You have a headache.  You have muscle cramps. Get help right away if:  You have chest pain.  You feel extremely weak or you faint.  You have bloody or black stools or stools that look like tar.  You have severe pain, cramping, or bloating in your abdomen.  You have trouble breathing or you are breathing very quickly.  Your heart is beating very quickly.  Your skin feels cold and clammy.  You feel confused.  You have signs of dehydration, such as: ? Dark urine, very little urine, or no urine. ? Cracked lips. ? Dry mouth. ? Sunken eyes. ? Sleepiness. ? Weakness. Summary  Diarrhea is frequent loose and watery bowel movements. Diarrhea can make you feel weak and cause you to become dehydrated.  Drink enough fluids to keep your urine pale yellow.  Make sure that you wash your hands after using the toilet. If soap and water are not available, use hand sanitizer.  Contact a health care provider if your diarrhea gets worse or you have new symptoms.  Get help right away if you have signs of dehydration. This information is not intended to replace advice given to you by your health care provider. Make sure you discuss any questions you have with your health care provider. Document Revised: 08/29/2018 Document Reviewed: 09/16/2017 Elsevier Patient Education  2020 [employer].

## 2019-10-11 ENCOUNTER — Telehealth: Payer: Self-pay | Admitting: Nurse Practitioner

## 2019-10-11 ENCOUNTER — Encounter: Payer: Self-pay | Admitting: Nurse Practitioner

## 2019-10-11 MED ORDER — ALBUTEROL SULFATE HFA 108 (90 BASE) MCG/ACT IN AERS: 1.0000 | INHALATION_SPRAY | Freq: Four times a day (QID) | RESPIRATORY_TRACT | 1 refills | Status: AC | PRN

## 2019-10-11 NOTE — Telephone Encounter (Signed)
I called him to get a symptom update.  He is taking the Augmentin and  Florastor as instructed.  He is asking if I will refill his albuterol.  Patient states his stomach is feeling much better today.  He has no diarrhea.  He is tolerating the medications well.  He is asked to call back on Monday for another symptom update.

## 2019-10-15 ENCOUNTER — Other Ambulatory Visit: Payer: Self-pay

## 2019-10-17 ENCOUNTER — Ambulatory Visit (INDEPENDENT_AMBULATORY_CARE_PROVIDER_SITE_OTHER): Payer: BC Managed Care – PPO | Admitting: Nurse Practitioner

## 2019-10-17 ENCOUNTER — Encounter: Payer: Self-pay | Admitting: Nurse Practitioner

## 2019-10-17 ENCOUNTER — Other Ambulatory Visit: Payer: Self-pay

## 2019-10-17 VITALS — BP 110/64 | HR 74 | Temp 98.0°F | Ht 66.0 in | Wt 224.0 lb

## 2019-10-17 DIAGNOSIS — K529 Noninfective gastroenteritis and colitis, unspecified: Secondary | ICD-10-CM

## 2019-10-17 DIAGNOSIS — E876 Hypokalemia: Secondary | ICD-10-CM

## 2019-10-17 HISTORY — DX: Noninfective gastroenteritis and colitis, unspecified: K52.9

## 2019-10-17 LAB — CBC WITH DIFFERENTIAL/PLATELET
Basophils Absolute: 0.1 10*3/uL (ref 0.0–0.1)
Basophils Relative: 1.2 % (ref 0.0–3.0)
Eosinophils Absolute: 0.2 10*3/uL (ref 0.0–0.7)
Eosinophils Relative: 4.3 % (ref 0.0–5.0)
HCT: 40.6 % (ref 39.0–52.0)
Hemoglobin: 13.8 g/dL (ref 13.0–17.0)
Lymphocytes Relative: 37.7 % (ref 12.0–46.0)
Lymphs Abs: 2 10*3/uL (ref 0.7–4.0)
MCHC: 34 g/dL (ref 30.0–36.0)
MCV: 81.6 fl (ref 78.0–100.0)
Monocytes Absolute: 0.4 10*3/uL (ref 0.1–1.0)
Monocytes Relative: 6.9 % (ref 3.0–12.0)
Neutro Abs: 2.6 10*3/uL (ref 1.4–7.7)
Neutrophils Relative %: 49.9 % (ref 43.0–77.0)
Platelets: 362 10*3/uL (ref 150.0–400.0)
RBC: 4.98 Mil/uL (ref 4.22–5.81)
RDW: 15.3 % (ref 11.5–15.5)
WBC: 5.2 10*3/uL (ref 4.0–10.5)

## 2019-10-17 LAB — BASIC METABOLIC PANEL
BUN: 10 mg/dL (ref 6–23)
CO2: 28 mEq/L (ref 19–32)
Calcium: 9.2 mg/dL (ref 8.4–10.5)
Chloride: 105 mEq/L (ref 96–112)
Creatinine, Ser: 0.96 mg/dL (ref 0.40–1.50)
GFR: 107.81 mL/min (ref >60.00)
Glucose, Bld: 89 mg/dL (ref 70–99)
Potassium: 4.5 mEq/L (ref 3.5–5.1)
Sodium: 138 mEq/L (ref 135–145)

## 2019-10-17 NOTE — Patient Instructions (Addendum)
May pick up [facility] a stor probiotic and 1 pill twice a day for 1 week and then can take 1 pill a day to help prevent diarrhea.  Or  You have been taking other probiotics and may continue.   Lab today.  See you in Aug for a physical exam.  See Dr. Allegra Lai 12/21/19 as planned.  Have a great summer!    Colitis  Colitis is inflammation of the colon. Colitis may last a short time (be acute), or it may last a long time (become chronic). What are the causes? This condition may be caused by:  Viruses.  Bacteria.  Reaction to medicine.  Certain autoimmune diseases such as Crohn's disease or ulcerative colitis.  Radiation treatment.  Decreased blood flow to the bowel (ischemia). What are the signs or symptoms? Symptoms of this condition include:  Watery diarrhea.  Passing bloody or tarry stool.  Pain.  Fever.  Vomiting.  Tiredness (fatigue).  Weight loss.  Bloating.  Abdominal pain.  Having fewer bowel movements than usual.  A strong and sudden urge to have a bowel movement.  Feeling like the bowel is not empty after a bowel movement. How is this diagnosed? This condition is diagnosed with a stool test or a blood test. You may also have other tests, such as:  X-rays.  CT scan.  Colonoscopy.  Endoscopy.  Biopsy. How is this treated? Treatment for this condition depends on the cause. The condition may be treated by:  Resting the bowel. This involves not eating or drinking for a period of time.  Fluids that are given through an IV.  Medicine for pain and diarrhea.  Antibiotic medicines.  Cortisone medicines.  Surgery. Follow these instructions at home: Eating and drinking   Follow instructions from your health care provider about eating or drinking restrictions.  Drink enough fluid to keep your urine pale yellow.  Work with a dietitian to determine which foods cause your condition to flare up.  Avoid foods that cause flare-ups.  Eat a  well-balanced diet. General instructions  If you were prescribed an antibiotic medicine, take it as told by your health care provider. Do not stop taking the antibiotic even if you start to feel better.  Take over-the-counter and prescription medicines only as told by your health care provider.  Keep all follow-up visits as told by your health care provider. This is important. Contact a health care provider if:  Your symptoms do not go away.  You develop new symptoms. Get help right away if you:  Have a fever that does not go away with treatment.  Develop chills.  Have extreme weakness, fainting, or dehydration.  Have repeated vomiting.  Develop severe pain in your abdomen.  Pass bloody or tarry stool. Summary  Colitis is inflammation of the colon. Colitis may last a short time (be acute), or it may last a long time (become chronic).  Treatment for this condition depends on the cause and may include resting the bowel, taking medicines, or having surgery.  If you were prescribed an antibiotic medicine, take it as told by your health care provider. Do not stop taking the antibiotic even if you start to feel better.  Get help right away if you develop severe pain in your abdomen.  Keep all follow-up visits as told by your health care provider. This is important. This information is not intended to replace advice given to you by your health care provider. Make sure you discuss any questions you have with  your health care provider. Document Revised: 10/13/2017 Document Reviewed: 10/13/2017 Elsevier Patient Education  2020 [employer].

## 2019-10-19 ENCOUNTER — Encounter: Payer: Self-pay | Admitting: Nurse Practitioner

## 2019-10-19 NOTE — Progress Notes (Signed)
Established Patient Office Visit  Subjective:  Patient ID: Tyler Jacobs, adult    DOB: 1985/05/17  Age: 34 y.o. MRN: [id number]  CC:  Chief Complaint  Patient presents with  . Follow-up    COLITIS    HPI Tyler Jacobs is a 34 yo with  laparoscopic appendectomy for acute appendicitis March 2021, Covid infection in April 2021 treated as outpatient, ED visit 10/05/2019 for CT documented colitis initially treated with Cipro and Flagyl.  He was seen in follow-up 10/10/2019 having severe nausea vomiting.  His antibiotics were switched to Augmentin. C diff neg.   He has completed the course of Augmentin with just 1 more pill to take today.  He has full resolution of abdominal pain, stools getting a little runny today. He has been taking probiotics. He completed a 5-day course of potassium supplement for hypokalemia.  He feels well.  No fevers or chills.  He has noted no blood or melena in the stool.  He has a gastroenterology appointment arranged.   History of Covid infection April 2021 with symptoms of fatigue, loss of smell,, body aches, cough, asthma did not flare, no GI complaints, no immunoglobulin therapy.  He has declined the Covid vaccine.    Past Medical History:  Diagnosis Date  . Asthma   . Colitis, acute 10/17/2019  . COVID-19 virus infection 07/2019    Past Surgical History:  Procedure Laterality Date  . LAPAROSCOPIC APPENDECTOMY N/A 07/14/2019   Procedure: APPENDECTOMY LAPAROSCOPIC;  Surgeon: Duanne Guess, MD;  Location: [facility] ORS;  Service: General;  Laterality: N/A;    Family History  Problem Relation Age of Onset  . Hypertension Other   . Diabetes Other   . Cancer Mother   . Hypertension Mother     Social History   Socioeconomic History  . Marital status: Married    Spouse name: Jakobie Henslee  . Number of children: 2  . Years of education: Not on file  . Highest education level: Not on file  Occupational History  . Not on file  Tobacco Use  . Smoking  status: Never Smoker  . Smokeless tobacco: Never Used  Vaping Use  . Vaping Use: Never used  Substance and Sexual Activity  . Alcohol use: Not Currently    Comment: used to drink socially  . Drug use: No  . Sexual activity: Yes    Birth control/protection: None    Comment: married  Other Topics Concern  . Not on file  Social History Narrative  . Not on file   Social Determinants of Health   Financial Resource Strain:   . Difficulty of Paying Living Expenses:   Food Insecurity:   . Worried About Programme researcher, broadcasting/film/video in the Last Year:   . Barista in the Last Year:   Transportation Needs:   . Freight forwarder (Medical):   Marland Kitchen Lack of Transportation (Non-Medical):   Physical Activity:   . Days of Exercise per Week:   . Minutes of Exercise per Session:   Stress:   . Feeling of Stress :   Social Connections:   . Frequency of Communication with Friends and Family:   . Frequency of Social Gatherings with Friends and Family:   . Attends Religious Services:   . Active Member of Clubs or Organizations:   . Attends Banker Meetings:   Marland Kitchen Marital Status:   Intimate Partner Violence:   . Fear of Current or Ex-Partner:   .  Emotionally Abused:   Marland Kitchen Physically Abused:   . Sexually Abused:     Outpatient Medications Prior to Visit  Medication Sig Dispense Refill  . acetaminophen (TYLENOL) 500 MG tablet Take 2 tablets (1,000 mg total) by mouth every 6 (six) hours. 30 tablet 0  . albuterol (VENTOLIN HFA) 108 (90 Base) MCG/ACT inhaler Inhale 1-2 puffs into the lungs every 6 (six) hours as needed for wheezing or shortness of breath. 6.7 g 1  . amoxicillin-clavulanate (AUGMENTIN) 875-125 MG tablet Take 1 tablet by mouth 2 (two) times daily for 7 days. 14 tablet 0  . ibuprofen (ADVIL) 600 MG tablet Take 1 tablet (600 mg total) by mouth every 4 (four) hours as needed for fever, headache, moderate pain or cramping. 30 tablet 0  . ondansetron (ZOFRAN ODT) 4 MG  disintegrating tablet Take 1 tablet (4 mg total) by mouth every 8 (eight) hours as needed for nausea or vomiting. 20 tablet 0  . potassium chloride (KLOR-CON) 10 MEQ tablet Take 1 tablet (10 mEq total) by mouth daily for 5 days. 5 tablet 0   No facility-administered medications prior to visit.    No Known Allergies  ROS Review of Systems  Constitutional: Negative for chills, fatigue and fever.  HENT: Negative.   Respiratory: Negative.   Cardiovascular: Negative.   Gastrointestinal: Negative for abdominal pain and blood in stool.       Stool loose at times- no diarrhea.   Genitourinary: Negative.       Objective:    Physical Exam Vitals reviewed.  Constitutional:      Appearance: Normal appearance.  Cardiovascular:     Rate and Rhythm: Normal rate and regular rhythm.     Pulses: Normal pulses.     Heart sounds: Normal heart sounds.  Pulmonary:     Effort: Pulmonary effort is normal.     Breath sounds: Normal breath sounds.  Abdominal:     Palpations: Abdomen is soft.     Tenderness: There is no abdominal tenderness. There is no guarding.  Musculoskeletal:     Cervical back: Normal range of motion and neck supple.  Skin:    General: Skin is warm and dry.  Neurological:     General: No focal deficit present.     Mental Status: He is alert and oriented to person, place, and time.  Psychiatric:        Mood and Affect: Mood normal.        Behavior: Behavior normal.     BP 110/64 (BP Location: Left Arm, Patient Position: Sitting, Cuff Size: Normal)   Pulse 74   Temp 98 F (36.7 C) (Skin)   Ht 5\' 6"  (1.676 m)   Wt 224 lb (101.6 kg)   SpO2 98%   BMI 36.15 kg/m  Wt Readings from Last 3 Encounters:  10/17/19 224 lb (101.6 kg)  10/10/19 222 lb 12.8 oz (101.1 kg)  10/04/19 225 lb (102.1 kg)     Health Maintenance Due  Topic Date Due  . Hepatitis C Screening  Never done  . COVID-19 Vaccine (1) Never done  . TETANUS/TDAP  Never done  . PAP SMEAR-Modifier  Never  done    There are no preventive care reminders to display for this patient.  No results found for: TSH Lab Results  Component Value Date   WBC 5.2 10/17/2019   HGB 13.8 10/17/2019   HCT 40.6 10/17/2019   MCV 81.6 10/17/2019   PLT 362.0 10/17/2019   Lab Results  Component Value Date   NA 138 10/17/2019   K 4.5 10/17/2019   CO2 28 10/17/2019   GLUCOSE 89 10/17/2019   BUN 10 10/17/2019   CREATININE 0.96 10/17/2019   BILITOT 0.6 10/10/2019   ALKPHOS 63 10/10/2019   AST 32 10/10/2019   ALT 45 10/10/2019   PROT 6.8 10/10/2019   ALBUMIN 3.6 10/10/2019   CALCIUM 9.2 10/17/2019   ANIONGAP 8 10/04/2019   GFR 107.81 10/17/2019   No results found for: CHOL No results found for: HDL No results found for: LDLCALC No results found for: TRIG No results found for: CHOLHDL No results found for: HGBA1C    Assessment & Plan:   Problem List Items Addressed This Visit      Digestive   Colitis, acute - Primary   Relevant Orders   CBC with Differential/Platelet (Completed)     Other   Hypokalemia   Relevant Orders   Basic Metabolic Panel (BMET) (Completed)      No orders of the defined types were placed in this encounter. May pick up [facility] a stor probiotic and 1 pill twice a day for 1 week and then can take 1 pill a day to help prevent diarrhea.  Or  You have been taking other probiotics and may continue.  We discussed the rationale for getting a Covid vaccine.  He says he has no plans to do so at this time. Aug for a physical exam.  See Dr. Marius Ditch 12/21/19 as planned.    Follow-up: Return in about 6 weeks (around 11/26/2019).  This visit occurred during the SARS-CoV-2 public health emergency.  Safety protocols were in place, including screening questions prior to the visit, additional usage of staff PPE, and extensive cleaning of exam room while observing appropriate contact time as indicated for disinfecting solutions.    Denice Paradise, NP

## 2019-11-29 ENCOUNTER — Ambulatory Visit: Payer: BC Managed Care – PPO | Admitting: Nurse Practitioner

## 2019-12-21 ENCOUNTER — Ambulatory Visit: Payer: BC Managed Care – PPO | Admitting: Gastroenterology

## 2019-12-21 ENCOUNTER — Encounter: Payer: Self-pay | Admitting: *Deleted

## 2021-01-16 ENCOUNTER — Encounter: Payer: Self-pay | Admitting: General Surgery
